# Patient Record
Sex: Male | Born: 1970
Health system: Southern US, Community
[De-identification: ages and names within clinical notes are randomized; demographics above are authoritative.]

## PROBLEM LIST (undated history)

## (undated) DIAGNOSIS — I10 Essential (primary) hypertension: Secondary | ICD-10-CM

## (undated) DIAGNOSIS — F32A Depression, unspecified: Secondary | ICD-10-CM

## (undated) DIAGNOSIS — F329 Major depressive disorder, single episode, unspecified: Secondary | ICD-10-CM

## (undated) HISTORY — DX: Depression, unspecified: F32.A

---
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
## 2009-10-31 ENCOUNTER — Emergency Department (HOSPITAL_COMMUNITY): Admission: EM | Admit: 2009-10-31 | Discharge: 2009-10-31 | Payer: Self-pay | Admitting: Emergency Medicine

## 2015-02-18 ENCOUNTER — Emergency Department (HOSPITAL_COMMUNITY)
Admission: EM | Admit: 2015-02-18 | Discharge: 2015-02-18 | Disposition: A | Discharge status: Home / Self Care | Payer: Self-pay | Attending: Emergency Medicine | Admitting: Emergency Medicine

## 2015-02-18 ENCOUNTER — Encounter (HOSPITAL_COMMUNITY): Payer: Self-pay | Admitting: *Deleted

## 2015-02-18 DIAGNOSIS — R42 Dizziness and giddiness: Secondary | ICD-10-CM | POA: Insufficient documentation

## 2015-02-18 DIAGNOSIS — Z720 Tobacco use: Secondary | ICD-10-CM | POA: Insufficient documentation

## 2015-02-18 DIAGNOSIS — I10 Essential (primary) hypertension: Secondary | ICD-10-CM | POA: Insufficient documentation

## 2015-02-18 HISTORY — DX: Essential (primary) hypertension: I10

## 2015-02-18 LAB — CBC WITH DIFFERENTIAL/PLATELET
Basophils Absolute: 0 10*3/uL (ref 0.0–0.1)
Basophils Relative: 0 %
EOS ABS: 0.1 10*3/uL (ref 0.0–0.7)
EOS PCT: 2 %
HCT: 47 % (ref 39.0–52.0)
Hemoglobin: 16.2 g/dL (ref 13.0–17.0)
LYMPHS ABS: 2.5 10*3/uL (ref 0.7–4.0)
Lymphocytes Relative: 44 %
MCH: 31.7 pg (ref 26.0–34.0)
MCHC: 34.5 g/dL (ref 30.0–36.0)
MCV: 92 fL (ref 78.0–100.0)
MONO ABS: 0.4 10*3/uL (ref 0.1–1.0)
MONOS PCT: 7 %
Neutro Abs: 2.6 10*3/uL (ref 1.7–7.7)
Neutrophils Relative %: 47 %
PLATELETS: 198 10*3/uL (ref 150–400)
RBC: 5.11 MIL/uL (ref 4.22–5.81)
RDW: 12.9 % (ref 11.5–15.5)
WBC: 5.7 10*3/uL (ref 4.0–10.5)

## 2015-02-18 LAB — BASIC METABOLIC PANEL
Anion gap: 9 (ref 5–15)
BUN: 12 mg/dL (ref 6–20)
CALCIUM: 10 mg/dL (ref 8.9–10.3)
CHLORIDE: 103 mmol/L (ref 101–111)
CO2: 28 mmol/L (ref 22–32)
CREATININE: 1.12 mg/dL (ref 0.61–1.24)
GFR calc Af Amer: >60 mL/min (ref >60)
Glucose, Bld: 91 mg/dL (ref 65–99)
Potassium: 4.4 mmol/L (ref 3.5–5.1)
SODIUM: 140 mmol/L (ref 135–145)

## 2015-02-18 MED ORDER — LISINOPRIL 10 MG PO TABS
10.0000 mg | ORAL_TABLET | Freq: Once | ORAL | Status: AC
  Administered 2015-02-18: 10 mg via ORAL
  Filled 2015-02-18: qty 1

## 2015-02-18 MED ORDER — LISINOPRIL 10 MG PO TABS: 10.0000 mg | ORAL_TABLET | Freq: Every day | ORAL | Status: DC

## 2015-02-18 NOTE — ED Notes (Signed)
PT HOOKED UP TO MONITOR WITH THE BP CUFF AND PULSE OX

## 2015-02-18 NOTE — Discharge Instructions (Signed)
Hypertension Hypertension, commonly called high blood pressure, is when the force of blood pumping through your arteries is too strong. Your arteries are the blood vessels that carry blood from your heart throughout your body. A blood pressure reading consists of a higher number over a lower number, such as 110/72. The higher number (systolic) is the pressure inside your arteries when your heart pumps. The lower number (diastolic) is the pressure inside your arteries when your heart relaxes. Ideally you want your blood pressure below 120/80. Hypertension forces your heart to work harder to pump blood. Your arteries may become narrow or stiff. Having untreated or uncontrolled hypertension can cause heart attack, stroke, kidney disease, and other problems. RISK FACTORS Some risk factors for high blood pressure are controllable. Others are not.  Risk factors you cannot control include:   Race. You may be at higher risk if you are African American.  Age. Risk increases with age.  Gender. Men are at higher risk than women before age 45 years. After age 65, women are at higher risk than men. Risk factors you can control include:  Not getting enough exercise or physical activity.  Being overweight.  Getting too much fat, sugar, calories, or salt in your diet.  Drinking too much alcohol. SIGNS AND SYMPTOMS Hypertension does not usually cause signs or symptoms. Extremely high blood pressure (hypertensive crisis) may cause headache, anxiety, shortness of breath, and nosebleed. DIAGNOSIS To check if you have hypertension, your health care provider will measure your blood pressure while you are seated, with your arm held at the level of your heart. It should be measured at least twice using the same arm. Certain conditions can cause a difference in blood pressure between your right and left arms. A blood pressure reading that is higher than normal on one occasion does not mean that you need treatment. If  it is not clear whether you have high blood pressure, you may be asked to return on a different day to have your blood pressure checked again. Or, you may be asked to monitor your blood pressure at home for 1 or more weeks. TREATMENT Treating high blood pressure includes making lifestyle changes and possibly taking medicine. Living a healthy lifestyle can help lower high blood pressure. You may need to change some of your habits. Lifestyle changes may include:  Following the DASH diet. This diet is high in fruits, vegetables, and whole grains. It is low in salt, red meat, and added sugars.  Keep your sodium intake below 2,300 mg per day.  Getting at least 30-45 minutes of aerobic exercise at least 4 times per week.  Losing weight if necessary.  Not smoking.  Limiting alcoholic beverages.  Learning ways to reduce stress. Your health care provider may prescribe medicine if lifestyle changes are not enough to get your blood pressure under control, and if one of the following is true:  You are 18-59 years of age and your systolic blood pressure is above 140.  You are 60 years of age or older, and your systolic blood pressure is above 150.  Your diastolic blood pressure is above 90.  You have diabetes, and your systolic blood pressure is over 140 or your diastolic blood pressure is over 90.  You have kidney disease and your blood pressure is above 140/90.  You have heart disease and your blood pressure is above 140/90. Your personal target blood pressure may vary depending on your medical conditions, your age, and other factors. HOME CARE INSTRUCTIONS    Have your blood pressure rechecked as directed by your health care provider.   Take medicines only as directed by your health care provider. Follow the directions carefully. Blood pressure medicines must be taken as prescribed. The medicine does not work as well when you skip doses. Skipping doses also puts you at risk for  problems.  Do not smoke.   Monitor your blood pressure at home as directed by your health care provider. SEEK MEDICAL CARE IF:   You think you are having a reaction to medicines taken.  You have recurrent headaches or feel dizzy.  You have swelling in your ankles.  You have trouble with your vision. SEEK IMMEDIATE MEDICAL CARE IF:  You develop a severe headache or confusion.  You have unusual weakness, numbness, or feel faint.  You have severe chest or abdominal pain.  You vomit repeatedly.  You have trouble breathing. MAKE SURE YOU:   Understand these instructions.  Will watch your condition.  Will get help right away if you are not doing well or get worse.   This information is not intended to replace advice given to you by your health care provider. Make sure you discuss any questions you have with your health care provider.   Document Released: 04/10/2005 Document Revised: 08/25/2014 Document Reviewed: 01/31/2013 Elsevier Interactive Patient Education 2016 [employer]  

## 2015-02-18 NOTE — ED Notes (Addendum)
Patient reports he is has no been taking his blood pressure medication, lisinopril, as prescribed and he feels like his blood pressure is back up. Patient reports intermittent headaches and dizziness. Patient also reports increased stress.

## 2015-02-18 NOTE — ED Provider Notes (Signed)
CSN: [id number]     Arrival date & time 02/18/15  1345 History   First MD Initiated Contact with Patient 02/18/15 1625     Chief Complaint  Patient presents with  . Hypertension  . Headache     (Consider location/radiation/quality/duration/timing/severity/associated sxs/prior Treatment) Patient is a 44 y.o. male presenting with dizziness. The history is provided by the patient.  Dizziness Quality:  Vertigo Severity:  Moderate Onset quality:  Gradual Duration:  1 day Timing:  Intermittent Progression:  Waxing and waning Chronicity:  Recurrent Context: not when bending over, not with bowel movement, not with ear pain, not with eye movement, not with head movement, not with inactivity, not with loss of consciousness, not with medication, not with physical activity, not when standing up and not when urinating   Relieved by:  None tried Worsened by:  Nothing Ineffective treatments:  None tried Associated symptoms: no blood in stool, no chest pain, no diarrhea, no headaches, no nausea, no palpitations, no shortness of breath, no syncope, no tinnitus, no vision changes, no vomiting and no weakness   Risk factors: no hx of vertigo, no Meniere's disease and no multiple medications     Past Medical History  Diagnosis Date  . Hypertension    No past surgical history on file. No family history on file. Social History  Substance Use Topics  . Smoking status: Current Every Day Smoker  . Smokeless tobacco: Not on file  . Alcohol Use: No    Review of Systems  Constitutional: Negative for fever.  HENT: Negative for facial swelling and tinnitus.   Respiratory: Negative for shortness of breath.   Cardiovascular: Negative for chest pain, palpitations and syncope.  Gastrointestinal: Negative for nausea, vomiting, abdominal pain, diarrhea and blood in stool.  Genitourinary: Negative for dysuria.  Musculoskeletal: Negative for back pain.  Skin: Negative for rash.  Neurological: Positive  for dizziness. Negative for weakness and headaches.  Psychiatric/Behavioral: Negative for confusion.      Allergies  Review of patient's allergies indicates no known allergies.  Home Medications   Prior to Admission medications   Medication Sig Start Date End Date Taking? Authorizing Provider  lisinopril (PRINIVIL,ZESTRIL) 10 MG tablet Take 1 tablet (10 mg total) by mouth daily. 02/18/15   Gavin PoundJustin Avelardo Reesman, MD  Multiple Vitamin (MULTIVITAMIN WITH MINERALS) TABS tablet Take 1 tablet by mouth daily.   Yes Historical Provider, MD  Omega-3 Fatty Acids (FISH OIL PO) Take 1 tablet by mouth daily.   Yes Historical Provider, MD   BP 136/86 mmHg  Pulse 68  Temp(Src) 99 F (37.2 C) (Oral)  Resp 16  SpO2 94% Physical Exam  Constitutional: He is oriented to person, place, and time. He appears well-developed and well-nourished. No distress.  HENT:  Head: Normocephalic and atraumatic.  Right Ear: External ear normal.  Left Ear: External ear normal.  Nose: Nose normal.  Mouth/Throat: Oropharynx is clear and moist. No oropharyngeal exudate.  Eyes: Conjunctivae and EOM are normal. Pupils are equal, round, and reactive to light. Right eye exhibits no discharge. Left eye exhibits no discharge. No scleral icterus.  Neck: Normal range of motion. Neck supple. No JVD present. No tracheal deviation present. No thyromegaly present.  Cardiovascular: Normal rate, regular rhythm and intact distal pulses.   Pulmonary/Chest: Effort normal. No stridor. No respiratory distress. He has no wheezes. He has no rales. He exhibits no tenderness.  Abdominal: Soft. He exhibits no distension. There is no tenderness.  Musculoskeletal: Normal range of motion. He exhibits no  edema or tenderness.  Lymphadenopathy:    He has no cervical adenopathy.  Neurological: He is alert and oriented to person, place, and time. He has normal strength. He displays no atrophy and no tremor. No cranial nerve deficit or sensory deficit. He  exhibits normal muscle tone. He displays no seizure activity. Coordination and gait normal. GCS eye subscore is 4. GCS verbal subscore is 5. GCS motor subscore is 6.  Skin: Skin is warm and dry. No rash noted. He is not diaphoretic. No erythema. No pallor.  Psychiatric: He has a normal mood and affect. His behavior is normal. Judgment and thought content normal.  Nursing note and vitals reviewed.   ED Course  Procedures (including critical care time) Labs Review Labs Reviewed  CBC WITH DIFFERENTIAL/PLATELET  BASIC METABOLIC PANEL    Imaging Review No results found. I have personally reviewed and evaluated these images and lab results as part of my medical decision-making.   EKG Interpretation None      MDM   Final diagnoses:  Essential hypertension    Patient endorses a history of hypertension. Has not been on medication for the past year due to cost issues. He had some intermittent dizziness lightheadedness associated with what he thought were reviewed and elevated blood pressure. He has not been checking it regularly. Patient does not have any chest pain, shortness of breath, or other concerning symptoms at this time. Patient referred for primary care follow-up. We will put him on 10 mg of lisinopril as he knows he was on a starting dose but is not sure if it was on higher doses.    Patient was given return precautions for HTN.  Pt advised on use of medications as applicable.  Patient was advised to monitor for symptoms of simple symptomatic hypotension and to monitor his blood pressures.  Advised to return for actely worsening symptoms, inability to take medications, or other acute concerns.  Advised to follow up with PCP in 1 week.  Patient was in agreement with and expressed understanding of follow plan, plan of care, and return precautions.  All questions answered prior to discharge.  Patient was discharged in stable condition, ambulating without difficulty.  Patient care was  discussed with my attending, Dr. Fayrene Fearing.    Gavin Pound, MD 02/19/15 1610  Rolland Porter, MD 02/24/15 9604  Rolland Porter, MD 02/24/15 [phone]

## 2015-12-21 ENCOUNTER — Ambulatory Visit (INDEPENDENT_AMBULATORY_CARE_PROVIDER_SITE_OTHER): Payer: Self-pay | Admitting: Family Medicine

## 2015-12-21 VITALS — BP 152/100 | HR 76 | Temp 98.4°F | Resp 18 | Ht 67.5 in | Wt 212.0 lb

## 2015-12-21 DIAGNOSIS — R809 Proteinuria, unspecified: Secondary | ICD-10-CM | POA: Diagnosis not present

## 2015-12-21 DIAGNOSIS — I10 Essential (primary) hypertension: Secondary | ICD-10-CM | POA: Diagnosis not present

## 2015-12-21 DIAGNOSIS — Z130 Encounter for screening for diseases of the blood and blood-forming organs and certain disorders involving the immune mechanism: Secondary | ICD-10-CM

## 2015-12-21 LAB — CBC WITH DIFFERENTIAL/PLATELET
BASOS ABS: 0 {cells}/uL (ref 0–200)
Basophils Relative: 0 %
EOS PCT: 1 %
Eosinophils Absolute: 66 cells/uL (ref 15–500)
HCT: 49.5 % (ref 38.5–50.0)
HEMOGLOBIN: 16.9 g/dL (ref 13.2–17.1)
LYMPHS ABS: 2508 {cells}/uL (ref 850–3900)
LYMPHS PCT: 38 %
MCH: 30.8 pg (ref 27.0–33.0)
MCHC: 34.1 g/dL (ref 32.0–36.0)
MCV: 90.3 fL (ref 80.0–100.0)
MPV: 10.3 fL (ref 7.5–12.5)
Monocytes Absolute: 528 cells/uL (ref 200–950)
Monocytes Relative: 8 %
NEUTROS PCT: 53 %
Neutro Abs: 3498 cells/uL (ref 1500–7800)
Platelets: 227 10*3/uL (ref 140–400)
RBC: 5.48 MIL/uL (ref 4.20–5.80)
RDW: 13.9 % (ref 11.0–15.0)
WBC: 6.6 10*3/uL (ref 3.8–10.8)

## 2015-12-21 LAB — COMPLETE METABOLIC PANEL WITH GFR
ALBUMIN: 4.9 g/dL (ref 3.6–5.1)
ALK PHOS: 65 U/L (ref 40–115)
ALT: 69 U/L — AB (ref 9–46)
AST: 31 U/L (ref 10–40)
BILIRUBIN TOTAL: 0.7 mg/dL (ref 0.2–1.2)
BUN: 13 mg/dL (ref 7–25)
CO2: 25 mmol/L (ref 20–31)
CREATININE: 1.13 mg/dL (ref 0.60–1.35)
Calcium: 9.9 mg/dL (ref 8.6–10.3)
Chloride: 105 mmol/L (ref 98–110)
GFR, Est African American: >89 mL/min (ref >=60)
GFR, Est Non African American: 79 mL/min (ref >=60)
GLUCOSE: 84 mg/dL (ref 65–99)
POTASSIUM: 4.5 mmol/L (ref 3.5–5.3)
SODIUM: 138 mmol/L (ref 135–146)
TOTAL PROTEIN: 7.7 g/dL (ref 6.1–8.1)

## 2015-12-21 MED ORDER — LISINOPRIL-HYDROCHLOROTHIAZIDE 10-12.5 MG PO TABS: 1.0000 | ORAL_TABLET | Freq: Every day | ORAL | 3 refills | Status: DC

## 2015-12-21 NOTE — Progress Notes (Addendum)
Patient ID: Nicholas Vargas, male    DOB: 05/30/1970, 45 y.o.   MRN: [id number]  PCP: Dolores Lorylark,Michael Lee, PA-C  Chief Complaint  Patient presents with  . Hypertension    Subjective:   HPI Presents for evaluation of elevated blood pressure. 45 year old African-American male presents today for the elevated blood pressure following a pre[facility] [employer] [facility] [employer]. Patient reports that his diastolic reading top 106 yesterday and on retake his blood pressure was 150/78.  He was referred to his primary care provider to have blood pressure evaluated and treated.  Reports that he has previously been taking lisinopril for hypertension and hasn't taken it for several months. He also reports some weight gain and a decline in  physical activity.Reports that he was diagnosed with hypertension almost 2 years ago.  He denies any chest pain shortness of breath headache or dizziness.  . Social History   Social History  . Marital status: Single    Spouse name: N/A  . Number of children: N/A  . Years of education: N/A   Occupational History  . Not on file.   Social History Main Topics  . Smoking status: Current Every Day Smoker  . Smokeless tobacco: Never Used  . Alcohol use Yes  . Drug use: No  . Sexual activity: Not on file   Other Topics Concern  . Not on file   Social History Narrative  . No narrative on file    .History reviewed. No pertinent family history.     Review of Systems  Constitutional: Positive for fatigue.  Respiratory: Negative.   Cardiovascular: Negative.   Neurological: Negative for dizziness and headaches.   See HPI    There are no active problems to display for this patient.    Prior to Admission medications   Medication Sig Start Date End Date Taking? Authorizing Provider  lisinopril (PRINIVIL,ZESTRIL) 10 MG tablet Take 1 tablet (10 mg total) by mouth daily. Patient not taking: Reported on 12/21/2015 02/18/15   Gavin PoundJustin Brooten, MD     No  Known Allergies     Objective:  Physical Exam  Constitutional: He is oriented to person, place, and time. He appears well-developed and well-nourished.  HENT:  Head: Normocephalic and atraumatic.  Left Ear: External ear normal.  Nose: Nose normal.  Mouth/Throat: Oropharynx is clear and moist.  Eyes: Conjunctivae and EOM are normal. Pupils are equal, round, and reactive to light.  Neck: Normal range of motion. Neck supple.  Cardiovascular: Normal rate, regular rhythm, normal heart sounds and intact distal pulses.   Pulmonary/Chest: Effort normal and breath sounds normal.  Musculoskeletal: Normal range of motion.  Neurological: He is alert and oriented to person, place, and time.  Skin: Skin is warm and dry.  Psychiatric: He has a normal mood and affect. His behavior is normal. Judgment and thought content normal.   . Vitals:   12/21/15 1222  BP: (!) 152/100  Pulse: 76  Resp: 18  Temp: 98.4 F (36.9 C)   Assessment & Plan:  1. Essential hypertension, uncontrolled. Patient reports non-compliance with medication regimen.   - COMPLETE METABOLIC PANEL WITH GFR, elevated ALT.   - POCT urinalysis dipstick-normal - POCT Microscopic Urinalysis ([facility])-normal   Plan:  Start- . lisinopril-hydrochlorothiazide (PRINZIDE,ZESTORETIC) 10-12.5 MG tablet   Check fasting lipids and recheck liver enzymes in 4-6 weeks.  2. Screening for deficiency anemia - CBC with Differential/Platelet-normal  Return 4-6 weeks for follow-up.  Godfrey PickKimberly S. Tiburcio PeaHarris, MSN, FNP-C [facility]

## 2015-12-21 NOTE — Patient Instructions (Addendum)
Return for care in 4-6 weeks to have your blood pressure rechecked.  Check your blood pressure periodically and maintain a log of readings.  IF you received an x-ray today, you will receive an invoice from [facility]. Please contact [facility] at [phone] with questions or concerns regarding your invoice.   IF you received labwork today, you will receive an invoice from [employer]. Please contact [facility] at [phone] with questions or concerns regarding your invoice.   Our billing staff will not be able to assist you with questions regarding bills from these companies.  You will be contacted with the lab results as soon as they are available. The fastest way to get your results is to activate your My Chart account. Instructions are located on the last page of this paperwork. If you have not heard from [place] regarding the results in 2 weeks, please contact this office.     Hypertension Hypertension, commonly called high blood pressure, is when the force of blood pumping through your arteries is too strong. Your arteries are the blood vessels that carry blood from your heart throughout your body. A blood pressure reading consists of a higher number over a lower number, such as 110/72. The higher number (systolic) is the pressure inside your arteries when your heart pumps. The lower number (diastolic) is the pressure inside your arteries when your heart relaxes. Ideally you want your blood pressure below 120/80. Hypertension forces your heart to work harder to pump blood. Your arteries may become narrow or stiff. Having untreated or uncontrolled hypertension can cause heart attack, stroke, kidney disease, and other problems. RISK FACTORS Some risk factors for high blood pressure are controllable. Others are not.  Risk factors you cannot control include:   Race. You may be at higher risk if you are African American.  Age. Risk increases with  age.  Gender. Men are at higher risk than women before age 68 years. After age 50, women are at higher risk than men. Risk factors you can control include:  Not getting enough exercise or physical activity.  Being overweight.  Getting too much fat, sugar, calories, or salt in your diet.  Drinking too much alcohol. SIGNS AND SYMPTOMS Hypertension does not usually cause signs or symptoms. Extremely high blood pressure (hypertensive crisis) may cause headache, anxiety, shortness of breath, and nosebleed. DIAGNOSIS To check if you have hypertension, your health care provider will measure your blood pressure while you are seated, with your arm held at the level of your heart. It should be measured at least twice using the same arm. Certain conditions can cause a difference in blood pressure between your right and left arms. A blood pressure reading that is higher than normal on one occasion does not mean that you need treatment. If it is not clear whether you have high blood pressure, you may be asked to return on a different day to have your blood pressure checked again. Or, you may be asked to monitor your blood pressure at home for 1 or more weeks. TREATMENT Treating high blood pressure includes making lifestyle changes and possibly taking medicine. Living a healthy lifestyle can help lower high blood pressure. You may need to change some of your habits. Lifestyle changes may include:  Following the DASH diet. This diet is high in fruits, vegetables, and whole grains. It is low in salt, red meat, and added sugars.  Keep your sodium intake below 2,300 mg per day.  Getting at least 30-45  minutes of aerobic exercise at least 4 times per week.  Losing weight if necessary.  Not smoking.  Limiting alcoholic beverages.  Learning ways to reduce stress. Your health care provider may prescribe medicine if lifestyle changes are not enough to get your blood pressure under control, and if one of  the following is true:  You are 54-58 years of age and your systolic blood pressure is above 140.  You are 69 years of age or older, and your systolic blood pressure is above 150.  Your diastolic blood pressure is above 90.  You have diabetes, and your systolic blood pressure is over 140 or your diastolic blood pressure is over 90.  You have kidney disease and your blood pressure is above 140/90.  You have heart disease and your blood pressure is above 140/90. Your personal target blood pressure may vary depending on your medical conditions, your age, and other factors. HOME CARE INSTRUCTIONS  Have your blood pressure rechecked as directed by your health care provider.   Take medicines only as directed by your health care provider. Follow the directions carefully. Blood pressure medicines must be taken as prescribed. The medicine does not work as well when you skip doses. Skipping doses also puts you at risk for problems.  Do not smoke.   Monitor your blood pressure at home as directed by your health care provider. SEEK MEDICAL CARE IF:   You think you are having a reaction to medicines taken.  You have recurrent headaches or feel dizzy.  You have swelling in your ankles.  You have trouble with your vision. SEEK IMMEDIATE MEDICAL CARE IF:  You develop a severe headache or confusion.  You have unusual weakness, numbness, or feel faint.  You have severe chest or abdominal pain.  You vomit repeatedly.  You have trouble breathing. MAKE SURE YOU:   Understand these instructions.  Will watch your condition.  Will get help right away if you are not doing well or get worse.   This information is not intended to replace advice given to you by your health care provider. Make sure you discuss any questions you have with your health care provider.   Document Released: 04/10/2005 Document Revised: 08/25/2014 Document Reviewed: 01/31/2013 Elsevier Interactive Patient  Education 2016 [employer].  Exercising to [employer] Exercising regularly is important. It has many health benefits, such as:  Improving your overall fitness, flexibility, and endurance.  Increasing your bone density.  Helping with weight control.  Decreasing your body fat.  Increasing your muscle strength.  Reducing stress and tension.  Improving your overall health. In order to become healthy and stay healthy, it is recommended that you do moderate-intensity and vigorous-intensity exercise. You can tell that you are exercising at a moderate intensity if you have a higher heart rate and faster breathing, but you are still able to hold a conversation. You can tell that you are exercising at a vigorous intensity if you are breathing much harder and faster and cannot hold a conversation while exercising. HOW OFTEN SHOULD I EXERCISE? Choose an activity that you enjoy and set realistic goals. Your health care provider can help you to make an activity plan that works for you. Exercise regularly as directed by your health care provider. This may include:   Doing resistance training twice each week, such as:  Push-ups.  Sit-ups.  Lifting weights.  Using resistance bands.  Doing a given intensity of exercise for a given amount of time. Choose from these  options:  150 minutes of moderate-intensity exercise every week.  75 minutes of vigorous-intensity exercise every week.  A mix of moderate-intensity and vigorous-intensity exercise every week. Children, pregnant women, people who are out of shape, people who are overweight, and older adults may need to consult a health care provider for individual recommendations. If you have any sort of medical condition, be sure to consult your health care provider before starting a new exercise program.  WHAT ARE SOME EXERCISE IDEAS? Some moderate-intensity exercise ideas include:   Walking at a rate of 1 mile in 15  minutes.  Biking.  Hiking.  Golfing.  Dancing. Some vigorous-intensity exercise ideas include:   Walking at a rate of at least 4.5 miles per hour.  Jogging or running at a rate of 5 miles per hour.  Biking at a rate of at least 10 miles per hour.  Lap swimming.  Roller-skating or in-line skating.  Cross-country skiing.  Vigorous competitive sports, such as football, basketball, and soccer.  Jumping rope.  Aerobic dancing. WHAT ARE SOME EVERYDAY ACTIVITIES THAT CAN HELP ME TO GET EXERCISE?  Yard work, such as:  Child psychotherapistushing a lawn mower.  Raking and bagging leaves.  Washing and waxing your car.  Pushing a stroller.  Shoveling snow.  Gardening.  Washing windows or floors. HOW CAN I BE MORE ACTIVE IN MY DAY-TO-DAY ACTIVITIES?  Use the stairs instead of the elevator.  Take a walk during your lunch break.  If you drive, park your car farther away from work or school.  If you take public transportation, get off one stop early and walk the rest of the way.  Make all of your phone calls while standing up and walking around.  Get up, stretch, and walk around every 30 minutes throughout the day. WHAT GUIDELINES SHOULD I FOLLOW WHILE EXERCISING?  Do not exercise so much that you hurt yourself, feel dizzy, or get very short of breath.  Consult your health care provider before starting a new exercise program.  Wear comfortable clothes and shoes with good support.  Drink plenty of water while you exercise to prevent dehydration or heat stroke. Body water is lost during exercise and must be replaced.  Work out until you breathe faster and your heart beats faster.   This information is not intended to replace advice given to you by your health care provider. Make sure you discuss any questions you have with your health care provider.   Document Released: 05/13/2010 Document Revised: 05/01/2014 Document Reviewed: 09/11/2013 Elsevier Interactive Patient Education  [employer] Elsevier Inc.

## 2015-12-23 ENCOUNTER — Encounter: Payer: Self-pay | Admitting: Family Medicine

## 2015-12-30 ENCOUNTER — Encounter: Payer: Self-pay | Admitting: Family Medicine

## 2015-12-30 ENCOUNTER — Other Ambulatory Visit: Payer: Self-pay | Admitting: Family Medicine

## 2015-12-30 DIAGNOSIS — R740 Nonspecific elevation of levels of transaminase and lactic acid dehydrogenase [LDH]: Principal | ICD-10-CM

## 2015-12-30 DIAGNOSIS — R7401 Elevation of levels of liver transaminase levels: Secondary | ICD-10-CM

## 2016-04-27 MED FILL — LISINOPRIL-HCTZ 10-12.5 MG: 10-12.5 | 90 days supply | Qty: 90 | Fill #0

## 2017-07-19 ENCOUNTER — Encounter (HOSPITAL_COMMUNITY): Payer: Self-pay | Admitting: Emergency Medicine

## 2017-07-19 ENCOUNTER — Other Ambulatory Visit: Payer: Self-pay

## 2017-07-19 ENCOUNTER — Ambulatory Visit (HOSPITAL_COMMUNITY)
Admission: EM | Admit: 2017-07-19 | Discharge: 2017-07-19 | Disposition: A | Discharge status: Home / Self Care | Payer: 59 | Attending: Emergency Medicine | Admitting: Emergency Medicine

## 2017-07-19 DIAGNOSIS — R1030 Lower abdominal pain, unspecified: Secondary | ICD-10-CM

## 2017-07-19 LAB — POCT I-STAT, CHEM 8
BUN: 15 mg/dL (ref 6–20)
CREATININE: 1.1 mg/dL (ref 0.61–1.24)
Calcium, Ion: 1.23 mmol/L (ref 1.15–1.40)
Chloride: 101 mmol/L (ref 101–111)
GLUCOSE: 103 mg/dL — AB (ref 65–99)
HEMATOCRIT: 51 % (ref 39.0–52.0)
Hemoglobin: 17.3 g/dL — ABNORMAL HIGH (ref 13.0–17.0)
Potassium: 4.3 mmol/L (ref 3.5–5.1)
Sodium: 138 mmol/L (ref 135–145)
TCO2: 26 mmol/L (ref 22–32)

## 2017-07-19 MED ORDER — ONDANSETRON 4 MG PO TBDP: 4.0000 mg | ORAL_TABLET | Freq: Three times a day (TID) | ORAL | 0 refills | Status: DC | PRN

## 2017-07-19 MED ORDER — OMEPRAZOLE 20 MG PO CPDR: 20.0000 mg | DELAYED_RELEASE_CAPSULE | Freq: Every day | ORAL | 0 refills | Status: DC

## 2017-07-19 NOTE — Discharge Instructions (Addendum)
Please begin trial of Prilosec, please take this daily consistently for 2-3 weeks.  Please use Zofran as needed for nausea.  Please continue to monitor your bowel movements, I expect this to gradually return to normal as well as abdominal pain.  Please drink plenty of fluids.   May consider following up with gastroenterology if symptoms persisting.

## 2017-07-19 NOTE — ED Provider Notes (Signed)
[facility]    CSN: [id number] Arrival date & time: 07/19/17  1024     History   Chief Complaint Chief Complaint  Patient presents with  . Abdominal Pain    HPI Nicholas Vargas is a 47 y.o. male history of hypertension presenting today with concern for abdominal pain as well as abnormal bowel movements.  States that he has had a rumbling sensation in his lower abdomen that is been associated with gas and occasional bowel movements.  He notices this is worse after eating or early in the morning.  This is been going on for approximately 5 days.  He is also noted to have increased urge to defecate.  He has had one episode of watery stools, overall stools have been loose.  Going approximately 4 times a day.  He has not had any bowel movements today.  Prior to onset of symptoms his bowels are regular, would go daily.  Denies any overt blood in the stools, but does note that his stools have been darker.  Does endorse some lightheadedness and dizziness.  Also having some nausea, denies vomiting.  Tolerating oral intake.  Patient states he drinks and eats a lot of spicy foods.  Also endorses an occasional burning sensation in his chest as well as sour burps.  HPI  Past Medical History:  Diagnosis Date  . Hypertension     There are no active problems to display for this patient.   History reviewed. No pertinent surgical history.     Home Medications    Prior to Admission medications   Medication Sig Start Date End Date Taking? Authorizing Provider  lisinopril-hydrochlorothiazide (PRINZIDE,ZESTORETIC) 10-12.5 MG tablet Take 1 tablet by mouth daily. 12/21/15   Bing NeighborsHarris, Kimberly S, FNP  omeprazole (PRILOSEC) 20 MG capsule Take 1 capsule (20 mg total) by mouth daily. 07/19/17   Wieters, Hallie C, PA-C  ondansetron (ZOFRAN ODT) 4 MG disintegrating tablet Take 1 tablet (4 mg total) by mouth every 8 (eight) hours as needed for nausea or vomiting. 07/19/17   Wieters, Junius CreamerHallie C, PA-C     Family History History reviewed. No pertinent family history.  Social History Social History   Tobacco Use  . Smoking status: Current Every Day Smoker  . Smokeless tobacco: Never Used  Substance Use Topics  . Alcohol use: Yes  . Drug use: No     Allergies   Patient has no known allergies.   Review of Systems Review of Systems  Constitutional: Negative for activity change, appetite change, fatigue and fever.  HENT: Negative for congestion, ear pain, postnasal drip, rhinorrhea and sore throat.   Eyes: Negative for pain and itching.  Respiratory: Negative for cough and shortness of breath.   Cardiovascular: Negative for chest pain.  Gastrointestinal: Positive for abdominal pain, diarrhea and nausea. Negative for constipation and vomiting.  Genitourinary: Negative for dysuria.  Musculoskeletal: Negative for myalgias.  Skin: Negative for rash.  Neurological: Positive for dizziness and light-headedness. Negative for weakness and headaches.     Physical Exam Triage Vital Signs ED Triage Vitals  Enc Vitals Group     BP 07/19/17 1059 (!) 156/82     Pulse Rate 07/19/17 1059 93     Resp --      Temp 07/19/17 1059 98.6 F (37 C)     Temp Source 07/19/17 1059 Oral     SpO2 07/19/17 1059 100 %     Weight --      Height --  Head Circumference --      Peak Flow --      Pain Score 07/19/17 1056 5     Pain Loc --      Pain Edu? --      Excl. in GC? --    No data found.  Updated Vital Signs BP (!) 156/82 (BP Location: Left Arm)   Pulse 93   Temp 98.6 F (37 C) (Oral)   SpO2 100%   Visual Acuity Right Eye Distance:   Left Eye Distance:   Bilateral Distance:    Right Eye Near:   Left Eye Near:    Bilateral Near:     Physical Exam  Constitutional: He appears well-developed and well-nourished.  HENT:  Head: Normocephalic and atraumatic.  Mouth/Throat: Oropharynx is clear and moist.  Eyes: Pupils are equal, round, and reactive to light. Conjunctivae and  EOM are normal.  Neck: Neck supple.  Cardiovascular: Normal rate and regular rhythm.  No murmur heard. Pulmonary/Chest: Effort normal and breath sounds normal. No respiratory distress.  Abdominal: Soft. There is no tenderness.  Abdomen is soft, nondistended , nontender to light and deep palpation  Musculoskeletal: He exhibits no edema.  Neurological: He is alert.  Skin: Skin is warm and dry.  Psychiatric: He has a normal mood and affect.  Nursing note and vitals reviewed.    UC Treatments / Results  Labs (all labs ordered are listed, but only abnormal results are displayed) Labs Reviewed - No data to display  EKG None Radiology No results found.  Procedures Procedures (including critical care time)  Medications Ordered in UC Medications - No data to display   Initial Impression / Assessment and Plan / UC Course  I have reviewed the triage vital signs and the nursing notes.  Pertinent labs & imaging results that were available during my care of the patient were reviewed by me and considered in my medical decision making (see chart for details).     Patient likely with form of gastroenteritis possibly some aspects of GERD.  Bowel obstruction seems unlikely given patient moving bowels.  Tolerating oral intake well with mild nausea.  Discomfort is relatively manageable.  Will do trial of Prilosec.  Zofran for nausea.  We will continue to monitor bowels, at this time they seem to be beginning to go back to normal.  May follow-up with gastroenterology if symptoms persisting for possible motility disorder.  Hemoglobin stable. no red flags. Discussed strict return precautions. Patient verbalized understanding and is agreeable with plan.   Final Clinical Impressions(s) / UC Diagnoses   Final diagnoses:  Lower abdominal pain    ED Discharge Orders        Ordered    omeprazole (PRILOSEC) 20 MG capsule  Daily     07/19/17 1144    ondansetron (ZOFRAN ODT) 4 MG disintegrating  tablet  Every 8 hours PRN     07/19/17 1147       Controlled Substance Prescriptions [place] Controlled Substance Registry consulted? Not Applicable   Lew Dawes, [place] 07/19/17 1212

## 2017-07-19 NOTE — ED Triage Notes (Signed)
C/o abdominal cramping with diarrhea onset one week

## 2017-11-23 ENCOUNTER — Encounter (HOSPITAL_COMMUNITY): Payer: Self-pay | Admitting: *Deleted

## 2017-11-23 ENCOUNTER — Emergency Department (HOSPITAL_COMMUNITY): Payer: 59

## 2017-11-23 ENCOUNTER — Emergency Department (HOSPITAL_COMMUNITY)
Admission: EM | Admit: 2017-11-23 | Discharge: 2017-11-23 | Disposition: A | Discharge status: Home / Self Care | Payer: 59 | Attending: Emergency Medicine | Admitting: Emergency Medicine

## 2017-11-23 ENCOUNTER — Other Ambulatory Visit: Payer: Self-pay

## 2017-11-23 DIAGNOSIS — Z79899 Other long term (current) drug therapy: Secondary | ICD-10-CM | POA: Diagnosis not present

## 2017-11-23 DIAGNOSIS — F17210 Nicotine dependence, cigarettes, uncomplicated: Secondary | ICD-10-CM | POA: Diagnosis not present

## 2017-11-23 DIAGNOSIS — R1032 Left lower quadrant pain: Secondary | ICD-10-CM

## 2017-11-23 DIAGNOSIS — I10 Essential (primary) hypertension: Secondary | ICD-10-CM | POA: Insufficient documentation

## 2017-11-23 LAB — URINALYSIS, ROUTINE W REFLEX MICROSCOPIC
Bilirubin Urine: NEGATIVE
GLUCOSE, UA: NEGATIVE mg/dL
Hgb urine dipstick: NEGATIVE
Ketones, ur: NEGATIVE mg/dL
LEUKOCYTES UA: NEGATIVE
NITRITE: NEGATIVE
PH: 7 (ref 5.0–8.0)
Protein, ur: NEGATIVE mg/dL
SPECIFIC GRAVITY, URINE: 1.021 (ref 1.005–1.030)

## 2017-11-23 LAB — COMPREHENSIVE METABOLIC PANEL
ALBUMIN: 4.4 g/dL (ref 3.5–5.0)
ALT: 84 U/L — ABNORMAL HIGH (ref 0–44)
ANION GAP: 9 (ref 5–15)
AST: 53 U/L — ABNORMAL HIGH (ref 15–41)
Alkaline Phosphatase: 50 U/L (ref 38–126)
BILIRUBIN TOTAL: 1 mg/dL (ref 0.3–1.2)
BUN: 11 mg/dL (ref 6–20)
CHLORIDE: 101 mmol/L (ref 98–111)
CO2: 27 mmol/L (ref 22–32)
Calcium: 9.7 mg/dL (ref 8.9–10.3)
Creatinine, Ser: 1.23 mg/dL (ref 0.61–1.24)
GFR calc Af Amer: >60 mL/min (ref >60)
GFR calc non Af Amer: >60 mL/min (ref >60)
GLUCOSE: 142 mg/dL — AB (ref 70–99)
POTASSIUM: 4.6 mmol/L (ref 3.5–5.1)
Sodium: 137 mmol/L (ref 135–145)
Total Protein: 7.1 g/dL (ref 6.5–8.1)

## 2017-11-23 LAB — CBC
HCT: 47.1 % (ref 39.0–52.0)
HEMOGLOBIN: 15 g/dL (ref 13.0–17.0)
MCH: 29.5 pg (ref 26.0–34.0)
MCHC: 31.8 g/dL (ref 30.0–36.0)
MCV: 92.5 fL (ref 78.0–100.0)
Platelets: 207 10*3/uL (ref 150–400)
RBC: 5.09 MIL/uL (ref 4.22–5.81)
RDW: 12.7 % (ref 11.5–15.5)
WBC: 5.1 10*3/uL (ref 4.0–10.5)

## 2017-11-23 LAB — POC OCCULT BLOOD, ED: Fecal Occult Bld: NEGATIVE

## 2017-11-23 LAB — LIPASE, BLOOD: LIPASE: 36 U/L (ref 11–51)

## 2017-11-23 MED ORDER — SODIUM CHLORIDE 0.9 % IV BOLUS
1000.0000 mL | Freq: Once | INTRAVENOUS | Status: AC
  Administered 2017-11-23: 1000 mL via INTRAVENOUS

## 2017-11-23 MED ORDER — IOHEXOL 300 MG/ML  SOLN
100.0000 mL | Freq: Once | INTRAMUSCULAR | Status: AC | PRN
  Administered 2017-11-23: 100 mL via INTRAVENOUS

## 2017-11-23 MED ORDER — DICYCLOMINE HCL 20 MG PO TABS: 20.0000 mg | ORAL_TABLET | Freq: Two times a day (BID) | ORAL | 0 refills | Status: DC

## 2017-11-23 NOTE — ED Triage Notes (Signed)
Pt is not currently taking anything for his BP

## 2017-11-23 NOTE — ED Triage Notes (Signed)
Pt in c/o left sided abdominal pain with weakness, fatigue and dark stools x2 months

## 2017-11-23 NOTE — Discharge Instructions (Signed)
Return to ED for worsening symptoms, severe abdominal pain or chest pain, vomiting or coughing up blood, lightheadedness or loss of consciousness. °

## 2017-11-23 NOTE — ED Notes (Signed)
Patient transported to CT 

## 2017-11-23 NOTE — ED Provider Notes (Signed)
[facility] EMERGENCY DEPARTMENT Provider Note   CSN: [id number] Arrival date & time: 11/23/17  7829     History   Chief Complaint Chief Complaint  Patient presents with  . Abdominal Pain    HPI Nicholas Vargas is a 47 y.o. male with a past medical history of hypertension, who presents to ED for evaluation of 25-month history of left lower quadrant abdominal pain, diarrhea, darker stools.  He states that he was seen at urgent care when the symptoms first began.  He was given symptomatic treatment with PPI and antiemetics.  However, he states that his symptoms have not improved.  He reports decrease in appetite, nausea, one episode of dysuria.  He has not tried any other medicine to help with his symptoms.  He has not followed up with a GI provider before.  He denies any prior abdominal surgeries.  Denies any chest pain, shortness of breath, hematuria, vomiting, penile symptoms.  He reports daily alcohol use (1 bottle of wine daily), vaping.  Denies any other drug use.  HPI  Past Medical History:  Diagnosis Date  . Hypertension     There are no active problems to display for this patient.   History reviewed. No pertinent surgical history.      Home Medications    Prior to Admission medications   Medication Sig Start Date End Date Taking? Authorizing Provider  dicyclomine (BENTYL) 20 MG tablet Take 1 tablet (20 mg total) by mouth 2 (two) times daily. 11/23/17   Arvid Marengo, PA-C  lisinopril-hydrochlorothiazide (PRINZIDE,ZESTORETIC) 10-12.5 MG tablet Take 1 tablet by mouth daily. 12/21/15   Bing Neighbors, FNP  omeprazole (PRILOSEC) 20 MG capsule Take 1 capsule (20 mg total) by mouth daily. 07/19/17   Wieters, Hallie C, PA-C  ondansetron (ZOFRAN ODT) 4 MG disintegrating tablet Take 1 tablet (4 mg total) by mouth every 8 (eight) hours as needed for nausea or vomiting. 07/19/17   Wieters, Junius Creamer, PA-C    Family History History reviewed. No pertinent family  history.  Social History Social History   Tobacco Use  . Smoking status: Current Every Day Smoker  . Smokeless tobacco: Never Used  Substance Use Topics  . Alcohol use: Yes  . Drug use: No     Allergies   Patient has no known allergies.   Review of Systems Review of Systems  Constitutional: Positive for appetite change. Negative for chills and fever.  HENT: Negative for ear pain, rhinorrhea, sneezing and sore throat.   Eyes: Negative for photophobia and visual disturbance.  Respiratory: Negative for cough, chest tightness, shortness of breath and wheezing.   Cardiovascular: Negative for chest pain and palpitations.  Gastrointestinal: Positive for abdominal pain, blood in stool, diarrhea and nausea. Negative for constipation and vomiting.  Genitourinary: Negative for dysuria, hematuria and urgency.  Musculoskeletal: Negative for myalgias.  Skin: Negative for rash.  Neurological: Negative for dizziness, weakness and light-headedness.     Physical Exam Updated Vital Signs BP (!) 138/91   Pulse 63   Temp 98.1 F (36.7 C) (Oral)   Resp 16   SpO2 98%   Physical Exam  Constitutional: He appears well-developed and well-nourished. No distress.  HENT:  Head: Normocephalic and atraumatic.  Nose: Nose normal.  Eyes: Conjunctivae and EOM are normal. Left eye exhibits no discharge. No scleral icterus.  Neck: Normal range of motion. Neck supple.  Cardiovascular: Normal rate, regular rhythm, normal heart sounds and intact distal pulses. Exam reveals no gallop and no  friction rub.  No murmur heard. Pulmonary/Chest: Effort normal and breath sounds normal. No respiratory distress.  Abdominal: Soft. Bowel sounds are normal. He exhibits no distension. There is tenderness in the left lower quadrant. There is no guarding.  Musculoskeletal: Normal range of motion. He exhibits no edema.  Neurological: He is alert. He exhibits normal muscle tone. Coordination normal.  Skin: Skin is warm  and dry. No rash noted.  Psychiatric: He has a normal mood and affect.  Nursing note and vitals reviewed.    ED Treatments / Results  Labs (all labs ordered are listed, but only abnormal results are displayed) Labs Reviewed  COMPREHENSIVE METABOLIC PANEL - Abnormal; Notable for the following components:      Result Value   Glucose, Bld 142 (*)    AST 53 (*)    ALT 84 (*)    All other components within normal limits  URINALYSIS, ROUTINE W REFLEX MICROSCOPIC - Abnormal; Notable for the following components:   Color, Urine COLORLESS (*)    All other components within normal limits  LIPASE, BLOOD  CBC  POC OCCULT BLOOD, ED    EKG None  Radiology Ct Abdomen Pelvis W Contrast  Result Date: 11/23/2017 CLINICAL DATA:  Abdominal pain, LEFT side abdominal pain, weakness, fatigue, and dark stools for 2 months, question diverticulitis, history hypertension EXAM: CT ABDOMEN AND PELVIS WITH CONTRAST TECHNIQUE: Multidetector CT imaging of the abdomen and pelvis was performed using the standard protocol following bolus administration of intravenous contrast. Sagittal and coronal MPR images reconstructed from axial data set. CONTRAST:  OMNIPAQUE IOHEXOL 300 MG/ML SOLN IV. No oral contrast. COMPARISON:  None FINDINGS: Lower chest: Lung bases clear Hepatobiliary: Mild fatty infiltration of liver. Gallbladder and liver otherwise normal appearance. Pancreas: Normal appearance Spleen: Normal appearance Adrenals/Urinary Tract: Adrenal glands, kidneys, ureters, and bladder normal appearance. No urinary tract calcification or dilatation. Stomach/Bowel: Stomach incompletely distended. Retrocecal appendix. Bowel loops unremarkable. No colonic diverticulosis or evidence of diverticulitis identified. Vascular/Lymphatic: Vascular structures unremarkable. No adenopathy. Reproductive: Minimal prostatic enlargement Other: No free air or free fluid. Tiny RIGHT inguinal hernia containing fat. Musculoskeletal:  Unremarkable IMPRESSION: Tiny RIGHT inguinal hernia containing fat. No acute intra-abdominal or intrapelvic abnormalities. Electronically Signed   By: Ulyses Southward M.D.   On: 11/23/2017 11:08    Procedures Procedures (including critical care time)  Medications Ordered in ED Medications  sodium chloride 0.9 % bolus 1,000 mL (0 mLs Intravenous Stopped 11/23/17 1114)  iohexol (OMNIPAQUE) 300 MG/ML solution 100 mL (100 mLs Intravenous Contrast Given 11/23/17 1044)     Initial Impression / Assessment and Plan / ED Course  I have reviewed the triage vital signs and the nursing notes.  Pertinent labs & imaging results that were available during my care of the patient were reviewed by me and considered in my medical decision making (see chart for details).     47 year old male with past medical history of hypertension presents to ED for evaluation of 66-month history of left lower quadrant abdominal pain, diarrhea and darker stools.  No improvement with PPI and antiemetics given at urgent care when symptoms began.  Denies any prior abdominal surgeries, chest pain, shortness of breath.  On physical exam he is overall well-appearing.  Patient does appear anxious.  There is tenderness palpation of the left lower quadrant with no rebound or guarding noted.  He is afebrile.  Other vital signs remained within normal limits.  CBC with normal hemoglobin hematocrit.  CBC, CMP, lipase unremarkable.  Fecal  occult blood test is negative.  Urinalysis unremarkable.  CT of abdomen pelvis with no acute process.  Suspect that his symptoms could have some component of IBS.  He reports improvement in his symptoms with fluids given here.  Will give referral to GI, antispasmodics and return precautions for any severe worsening symptoms in the ED.  Portions of this note were generated with Scientist, clinical (histocompatibility and immunogenetics)Dragon dictation software. Dictation errors may occur despite best attempts at proofreading.   Final Clinical Impressions(s) / ED Diagnoses    Final diagnoses:  Left lower quadrant pain    ED Discharge Orders        Ordered    dicyclomine (BENTYL) 20 MG tablet  2 times daily     11/23/17 1200       Dietrich PatesKhatri, Sidney Silberman, PA-C 11/23/17 1202    Margarita Grizzleay, Danielle, MD 11/23/17 1535

## 2018-09-19 ENCOUNTER — Emergency Department (HOSPITAL_COMMUNITY): Payer: 59

## 2018-09-19 ENCOUNTER — Other Ambulatory Visit: Payer: Self-pay

## 2018-09-19 ENCOUNTER — Emergency Department (HOSPITAL_COMMUNITY)
Admission: EM | Admit: 2018-09-19 | Discharge: 2018-09-19 | Disposition: A | Discharge status: Home / Self Care | Payer: 59 | Attending: Emergency Medicine | Admitting: Emergency Medicine

## 2018-09-19 ENCOUNTER — Encounter (HOSPITAL_COMMUNITY): Payer: Self-pay | Admitting: *Deleted

## 2018-09-19 DIAGNOSIS — R030 Elevated blood-pressure reading, without diagnosis of hypertension: Secondary | ICD-10-CM | POA: Diagnosis not present

## 2018-09-19 DIAGNOSIS — F17210 Nicotine dependence, cigarettes, uncomplicated: Secondary | ICD-10-CM | POA: Diagnosis not present

## 2018-09-19 DIAGNOSIS — M545 Low back pain: Secondary | ICD-10-CM | POA: Insufficient documentation

## 2018-09-19 DIAGNOSIS — I10 Essential (primary) hypertension: Secondary | ICD-10-CM | POA: Insufficient documentation

## 2018-09-19 DIAGNOSIS — Z79899 Other long term (current) drug therapy: Secondary | ICD-10-CM | POA: Diagnosis not present

## 2018-09-19 MED ORDER — IBUPROFEN 600 MG PO TABS: 600.0000 mg | ORAL_TABLET | Freq: Four times a day (QID) | ORAL | 0 refills | Status: DC | PRN

## 2018-09-19 MED ORDER — LIDOCAINE 5 % EX PTCH
1.0000 | MEDICATED_PATCH | CUTANEOUS | Status: DC
  Administered 2018-09-19: 1 via TRANSDERMAL
  Filled 2018-09-19: qty 1

## 2018-09-19 MED ORDER — IBUPROFEN 400 MG PO TABS
600.0000 mg | ORAL_TABLET | Freq: Once | ORAL | Status: AC
  Administered 2018-09-19: 600 mg via ORAL
  Filled 2018-09-19: qty 1

## 2018-09-19 MED ORDER — METHOCARBAMOL 500 MG PO TABS: 500.0000 mg | ORAL_TABLET | Freq: Two times a day (BID) | ORAL | 0 refills | Status: DC
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
## 2018-10-23 ENCOUNTER — Ambulatory Visit: Payer: Commercial Managed Care - PPO | Admitting: Registered Nurse

## 2018-10-28 ENCOUNTER — Ambulatory Visit: Payer: Self-pay | Admitting: Registered Nurse

## 2018-10-29 ENCOUNTER — Other Ambulatory Visit: Payer: Self-pay

## 2018-10-29 ENCOUNTER — Ambulatory Visit: Payer: 59 | Admitting: Family Medicine

## 2018-10-29 ENCOUNTER — Encounter: Payer: Self-pay | Admitting: Family Medicine

## 2018-10-29 VITALS — BP 147/82 | HR 100 | Temp 98.7°F | Ht 67.0 in | Wt 219.2 lb

## 2018-10-29 DIAGNOSIS — K219 Gastro-esophageal reflux disease without esophagitis: Secondary | ICD-10-CM | POA: Diagnosis not present

## 2018-10-29 DIAGNOSIS — R7309 Other abnormal glucose: Secondary | ICD-10-CM | POA: Diagnosis not present

## 2018-10-29 DIAGNOSIS — I10 Essential (primary) hypertension: Secondary | ICD-10-CM

## 2018-10-29 MED ORDER — OMEPRAZOLE 20 MG PO CPDR: DELAYED_RELEASE_CAPSULE | ORAL | 5 refills | Status: DC

## 2018-10-29 MED ORDER — AMLODIPINE BESYLATE 5 MG PO TABS: 5.0000 mg | ORAL_TABLET | Freq: Every day | ORAL | 1 refills | Status: DC

## 2018-10-29 NOTE — Patient Instructions (Addendum)
     If you have lab work done today you will be contacted with your lab results within the next 2 weeks.  If you have not heard from [place] then please contact us. The fastest way to get your results is to register for My Chart.   IF you received an x-ray today, you will receive an invoice from [facility]. Please contact [facility] at [phone] with questions or concerns regarding your invoice.   IF you received labwork today, you will receive an invoice from [place]. Please contact [facility] at [phone] with questions or concerns regarding your invoice.   Our billing staff will not be able to assist you with questions regarding bills from these companies.  You will be contacted with the lab results as soon as they are available. The fastest way to get your results is to activate your My Chart account. Instructions are located on the last page of this paperwork. If you have not heard from [place] regarding the results in 2 weeks, please contact this office.     Fasting labwork prior to follow up appt-FASTING Gastro referral Take omeprazole before biggest meal Take bp cuff at home

## 2018-10-29 NOTE — Progress Notes (Signed)
Acute Office Visit  Subjective:    Patient ID: Nicholas Vargas, male    DOB: 27-Mar-1971, 48 y.o.   MRN: [id number]  Chief Complaint  Patient presents with  . Hypertension  . Gastroesophageal Reflux    HPI Patient is in today for HTN-took medication in the past but stopped taking lisinopril/HCTZ-did not like side effects. Pt states he was seen in the ER and told he needed to start medication.No headaches, no visual changes  Past Medical History:  Diagnosis Date  . Hypertension   PMH-mother with HTN Social History   Socioeconomic History  . Marital status: Single    Spouse name: Not on file  . Number of children: Not on file  . Years of education: Not on file  . Highest education level: Not on file  Occupational History  . Not on file  Social Needs  . Financial resource strain: Not on file  . Food insecurity    Worry: Not on file    Inability: Not on file  . Transportation needs    Medical: Not on file    Non-medical: Not on file  Tobacco Use  . Smoking status: Current Some Day Smoker  . Smokeless tobacco: Never Used  . Tobacco comment: Vape  Substance and Sexual Activity  . Alcohol use: Yes  . Drug use: No  . Sexual activity: Not on file  Lifestyle  . Physical activity    Days per week: Not on file    Minutes per session: Not on file  . Stress: Not on file  Relationships  . Social Herbalist on phone: Not on file    Gets together: Not on file    Attends religious service: Not on file    Active member of club or organization: Not on file    Attends meetings of clubs or organizations: Not on file    Relationship status: Not on file  . Intimate partner violence    Fear of current or ex partner: Not on file    Emotionally abused: Not on file    Physically abused: Not on file    Forced sexual activity: Not on file  Other Topics Concern  . Not on file  Social History Narrative  . Not on file    Outpatient Medications Prior to Visit   Medication Sig Dispense Refill  . ibuprofen (ADVIL) 600 MG tablet Take 1 tablet (600 mg total) by mouth every 6 (six) hours as needed. 30 tablet 0  . lisinopril-hydrochlorothiazide (PRINZIDE,ZESTORETIC) 10-12.5 MG tablet Take 1 tablet by mouth daily. 90 tablet 3  . methocarbamol (ROBAXIN) 500 MG tablet Take 1 tablet (500 mg total) by mouth 2 (two) times daily. 10 tablet 0  . ondansetron (ZOFRAN ODT) 4 MG disintegrating tablet Take 1 tablet (4 mg total) by mouth every 8 (eight) hours as needed for nausea or vomiting. 20 tablet 0  . omeprazole (PRILOSEC) 20 MG capsule Take 1 capsule (20 mg total) by mouth daily. (Patient not taking: Reported on 10/29/2018) 30 capsule 0  . dicyclomine (BENTYL) 20 MG tablet Take 1 tablet (20 mg total) by mouth 2 (two) times daily. (Patient not taking: Reported on 10/29/2018) 20 tablet 0   No facility-administered medications prior to visit.     No Known Allergies  Review of Systems  Respiratory: Negative for cough and shortness of breath.   Cardiovascular: Negative for chest pain, palpitations and leg swelling.  Gastrointestinal: Positive for heartburn.  stool loss  Objective:    Physical Exam  Constitutional: He appears well-developed and well-nourished.  HENT:  Head: Normocephalic and atraumatic.  Eyes: Conjunctivae are normal.  Cardiovascular: Normal rate, normal heart sounds and intact distal pulses.  Pulmonary/Chest: Effort normal and breath sounds normal.  Neurological: He is alert.    BP (!) 147/82 (BP Location: Right Arm, Patient Position: Sitting, Cuff Size: Large)   Pulse 100   Temp 98.7 F (37.1 C) (Oral)   Ht '5\' 7"'$  (1.702 m)   Wt 219 lb 3.2 oz (99.4 kg)   SpO2 97%   BMI 34.33 kg/m  Wt Readings from Last 3 Encounters:  10/29/18 219 lb 3.2 oz (99.4 kg)  12/21/15 212 lb (96.2 kg)    Health Maintenance Due  Topic Date Due  . HIV Screening  11/27/1987  . TETANUS/TDAP  11/27/1991    There are no preventive care reminders to  display for this patient.   No results found for: TSH Lab Results  Component Value Date   WBC 5.1 11/23/2017   HGB 15.0 11/23/2017   HCT 47.1 11/23/2017   MCV 92.5 11/23/2017   PLT 207 11/23/2017   Lab Results  Component Value Date   NA 137 11/23/2017   K 4.6 11/23/2017   CO2 27 11/23/2017   GLUCOSE 142 (H) 11/23/2017   BUN 11 11/23/2017   CREATININE 1.23 11/23/2017   BILITOT 1.0 11/23/2017   ALKPHOS 50 11/23/2017   AST 53 (H) 11/23/2017   ALT 84 (H) 11/23/2017   PROT 7.1 11/23/2017   ALBUMIN 4.4 11/23/2017   CALCIUM 9.7 11/23/2017   ANIONGAP 9 11/23/2017      Assessment & Plan:    1. Essential hypertension - Lipid Panel; Future - CMP14+EGFR; Future - TSH; Future - Urine Microscopic; Future - Ambulatory referral to Gastroenterology - EKG 12-Lead-sr Amlodipine-rx-risk/benefit/side effects  2. Elevated glucose - Hemoglobin A1c; Future  GERD-omeprazole-rx-GI referral Avonne Berkery Hannah Beat, MD

## 2018-10-30 ENCOUNTER — Encounter: Payer: Self-pay | Admitting: Gastroenterology

## 2018-10-31 DIAGNOSIS — K219 Gastro-esophageal reflux disease without esophagitis: Secondary | ICD-10-CM | POA: Insufficient documentation

## 2018-10-31 DIAGNOSIS — I10 Essential (primary) hypertension: Secondary | ICD-10-CM | POA: Insufficient documentation

## 2018-10-31 DIAGNOSIS — R7309 Other abnormal glucose: Secondary | ICD-10-CM | POA: Insufficient documentation

## 2018-11-22 ENCOUNTER — Other Ambulatory Visit: Payer: Self-pay | Admitting: Family Medicine

## 2018-11-22 DIAGNOSIS — M545 Low back pain, unspecified: Secondary | ICD-10-CM

## 2018-11-26 ENCOUNTER — Ambulatory Visit
Admission: RE | Admit: 2018-11-26 | Discharge: 2018-11-26 | Disposition: A | Discharge status: Home / Self Care | Payer: Worker's Compensation | Source: Ambulatory Visit | Attending: Family Medicine | Admitting: Family Medicine

## 2018-11-26 ENCOUNTER — Other Ambulatory Visit: Payer: 59

## 2018-11-26 ENCOUNTER — Ambulatory Visit: Payer: 59 | Admitting: Registered Nurse

## 2018-11-26 DIAGNOSIS — M545 Low back pain, unspecified: Secondary | ICD-10-CM

## 2018-11-28 ENCOUNTER — Ambulatory Visit: Payer: 59 | Admitting: Gastroenterology

## 2018-11-29 ENCOUNTER — Encounter: Payer: Self-pay | Admitting: Gastroenterology

## 2018-11-29 ENCOUNTER — Ambulatory Visit: Payer: 59 | Admitting: Gastroenterology

## 2018-12-23 ENCOUNTER — Ambulatory Visit: Payer: 59 | Admitting: Registered Nurse

## 2018-12-24 ENCOUNTER — Encounter: Payer: Self-pay | Admitting: Registered Nurse

## 2018-12-31 ENCOUNTER — Ambulatory Visit: Payer: Self-pay | Admitting: Gastroenterology

## 2019-01-08 ENCOUNTER — Telehealth: Payer: Self-pay | Admitting: Registered Nurse

## 2019-01-08 MED ORDER — AMLODIPINE BESYLATE 5 MG PO TABS: 5.0000 mg | ORAL_TABLET | Freq: Every day | ORAL | 0 refills | Status: DC

## 2019-01-08 NOTE — Telephone Encounter (Signed)
Medication Refill - Medication: amLODipine (NORVASC) 5 MG tablet    Has the patient contacted their pharmacy? Yes.   Pt states he has no more of this medication. Please advise.  (Agent: If no, request that the patient contact the pharmacy for the refill.) (Agent: If yes, when and what did the pharmacy advise?)  Preferred Pharmacy (with phone number or street name):  [facility], [place] - [address]  [place] [address]  Phone: [phone] Fax: [phone]  Not a 24 hour pharmacy; exact hours not known.     Agent: Please be advised that RX refills may take up to 3 business days. We ask that you follow-up with your pharmacy.

## 2019-01-08 NOTE — Telephone Encounter (Signed)
Requested Prescriptions  Pending Prescriptions Disp Refills  . amLODipine (NORVASC) 5 MG tablet 30 tablet 0    Sig: Take 1 tablet (5 mg total) by mouth daily.     Cardiovascular:  Calcium Channel Blockers Failed - 01/08/2019  4:28 PM      Failed - Last BP in normal range    BP Readings from Last 1 Encounters:  10/29/18 (!) 147/82         Passed - Valid encounter within last 6 months    Recent Outpatient Visits          2 months ago Essential hypertension   [facility] [place], [place]   3 years ago Essential hypertension   [facility], [place], [place]             Phone call to patient.  Notified him that 30 day courtesy refill of medication sent.  Asked him to call and schedule follow up appointment with PCP as soon as possible.

## 2019-01-23 ENCOUNTER — Other Ambulatory Visit: Payer: Self-pay

## 2019-01-23 DIAGNOSIS — I10 Essential (primary) hypertension: Secondary | ICD-10-CM

## 2019-01-23 MED ORDER — AMLODIPINE BESYLATE 5 MG PO TABS: 5.0000 mg | ORAL_TABLET | Freq: Every day | ORAL | 0 refills | Status: DC

## 2019-01-23 NOTE — Telephone Encounter (Signed)
Patient states this medication was sent to wrong pharmacy. Please re send to   [facility] [place], [address] AT [address] [place] [phone] (Phone) [phone] (Fax)

## 2019-01-23 NOTE — Progress Notes (Signed)
Reordering to send to correct pharmacy per pt request.  Cancelled rx at [facility] pharmacy.

## 2019-01-24 ENCOUNTER — Telehealth: Payer: Self-pay | Admitting: Registered Nurse

## 2019-01-24 ENCOUNTER — Other Ambulatory Visit: Payer: Self-pay | Admitting: Adult Health Nurse Practitioner

## 2019-01-24 DIAGNOSIS — I10 Essential (primary) hypertension: Secondary | ICD-10-CM

## 2019-01-24 MED ORDER — AMLODIPINE BESYLATE 5 MG PO TABS: 5.0000 mg | ORAL_TABLET | Freq: Every day | ORAL | 0 refills | Status: DC

## 2019-01-24 NOTE — Telephone Encounter (Signed)
Pt needs [place] to resend BP amLODipine (NORVASC) 5 MG tablet [[id number] please rsend asap..     med to [facility] on [address]  /  needa to go where his other Meds are going !

## 2019-02-06 ENCOUNTER — Ambulatory Visit: Payer: Self-pay | Admitting: Gastroenterology

## 2019-02-10 ENCOUNTER — Ambulatory Visit: Payer: 59 | Admitting: Registered Nurse

## 2019-03-13 ENCOUNTER — Ambulatory Visit: Payer: Self-pay | Admitting: Gastroenterology

## 2019-03-30 IMAGING — CT CT ABD-PELV W/ CM
2 of 5 series · 16 of 46 positions shown, 18 images · IV contrast (omnipaque)
Comparison: None

CLINICAL DATA: Abdominal pain, LEFT side abdominal pain, weakness,
fatigue, and dark stools for 2 months, question diverticulitis,
history hypertension

EXAM:
CT ABDOMEN AND PELVIS WITH CONTRAST
TECHNIQUE: Multidetector CT imaging of the abdomen and pelvis was performed
using the standard protocol following bolus administration of
intravenous contrast. Sagittal and coronal MPR images reconstructed
from axial data set.
CONTRAST:  100mL OMNIPAQUE IOHEXOL 300 MG/ML SOLN IV. No oral
contrast.

[Series 3: a/p w/ 5mm · axial · 0.88mm/px · z∈[+596,+1056]mm · 13 of 104 slices shown, 15 images]
[im 6/104  soft-tissue]
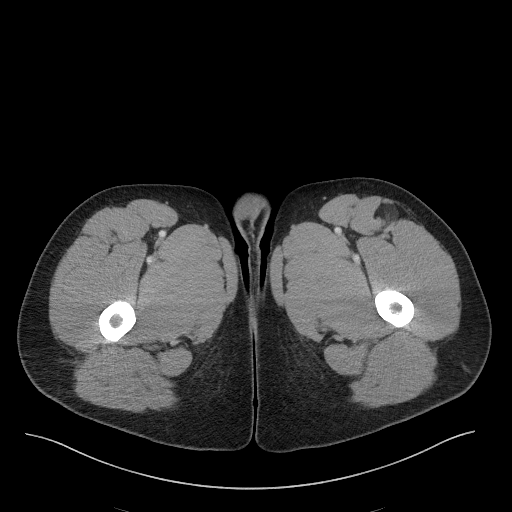
[im 6/104  bone]
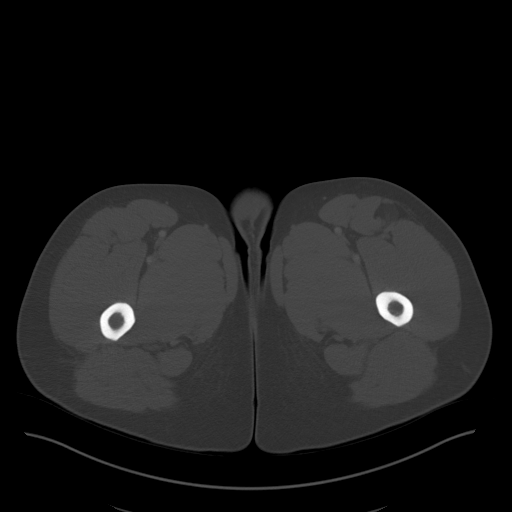
[im 17/104  soft-tissue]
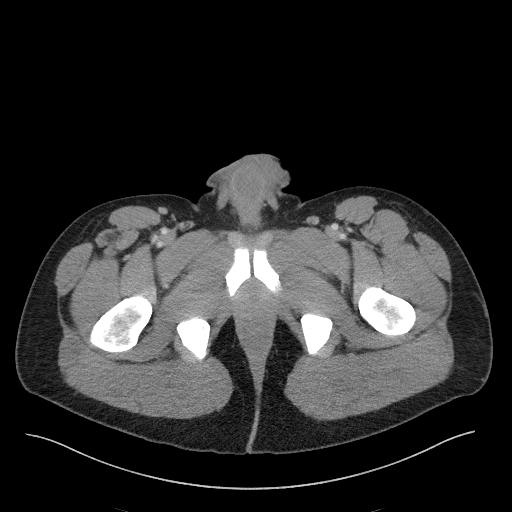
[im 22/104  soft-tissue]
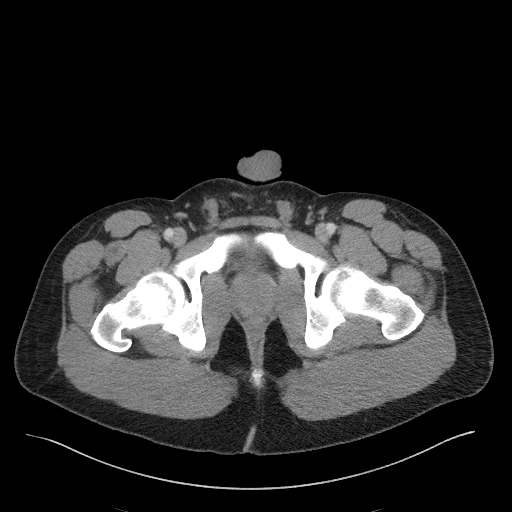
[im 28/104  soft-tissue]
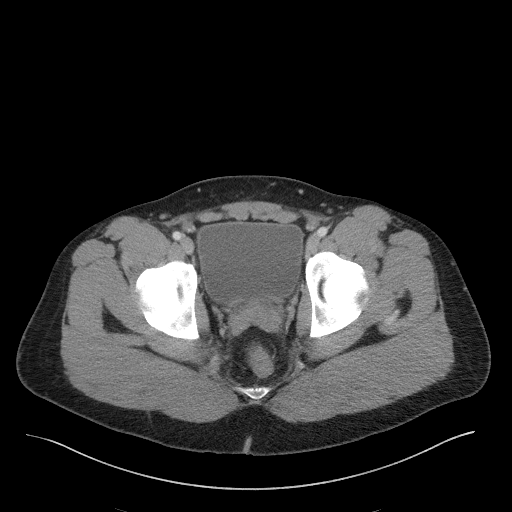
[im 38/104  soft-tissue]
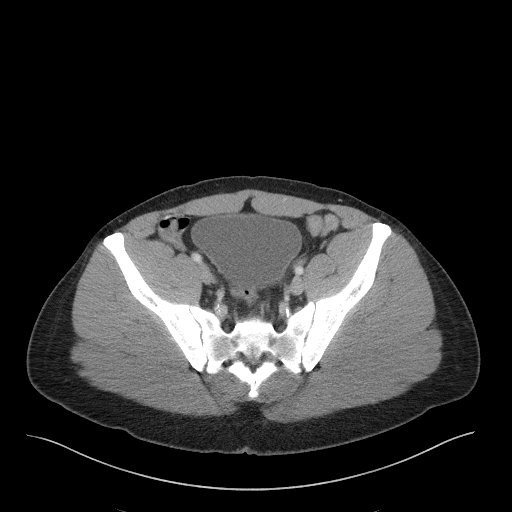
[im 44/104  soft-tissue]
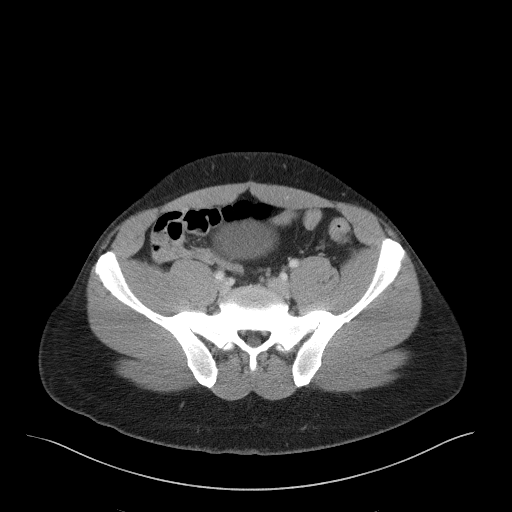
[im 55/104  soft-tissue]
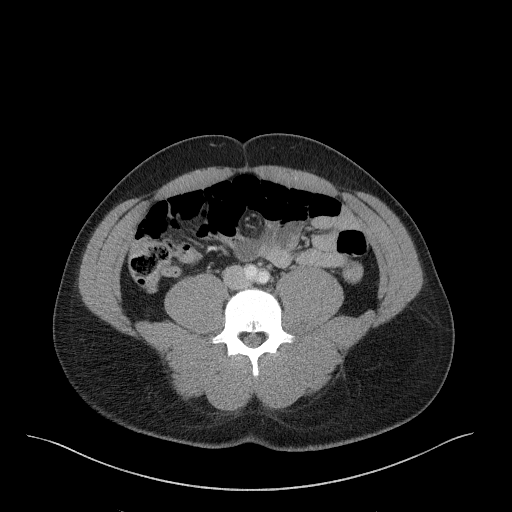
[im 60/104  soft-tissue]
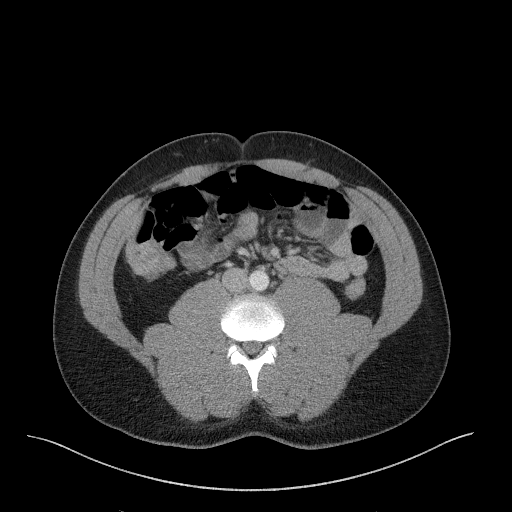
[im 66/104  soft-tissue]
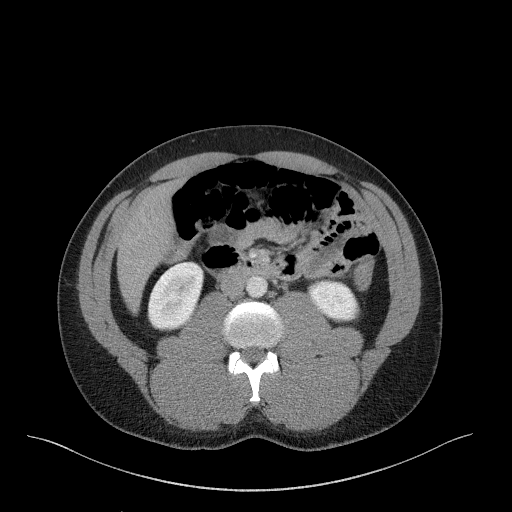
[im 66/104  bone]
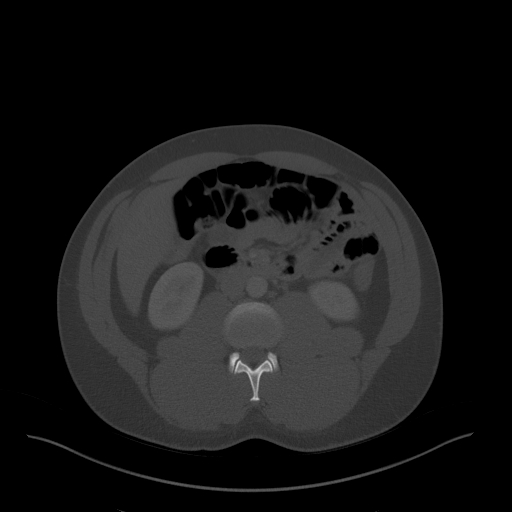
[im 76/104  soft-tissue]
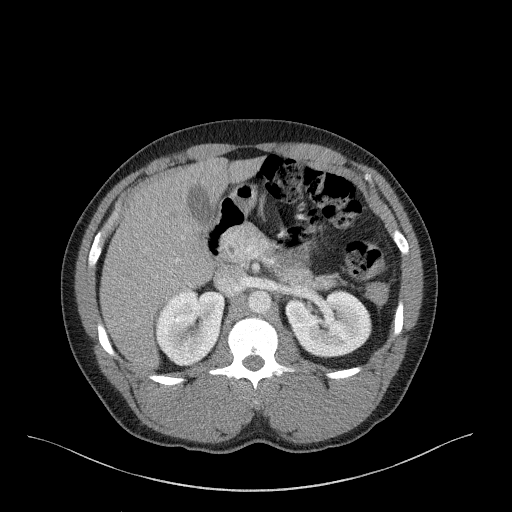
[im 82/104  soft-tissue]
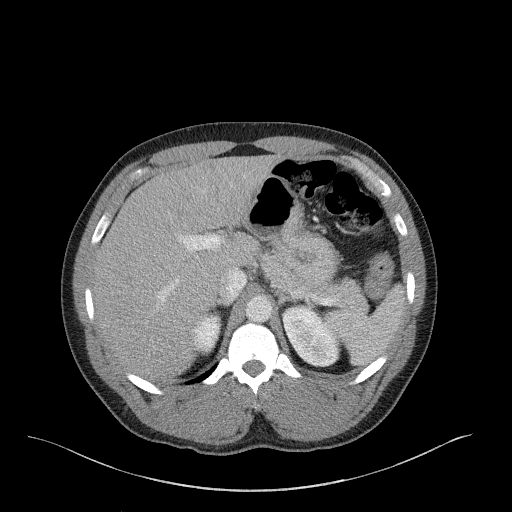
[im 87/104  soft-tissue]
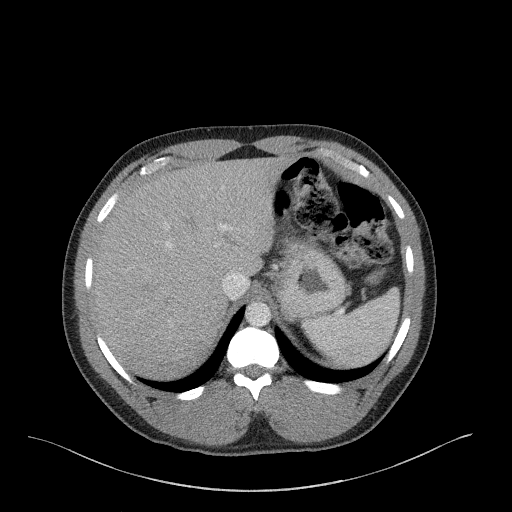
[im 98/104  soft-tissue]
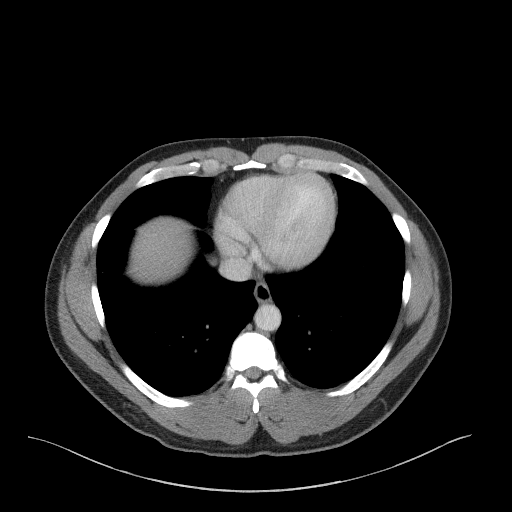

[Series 6: a/p w/ cor · coronal · 0.86mm/px · 3 of 166 slices shown]
[im 56/166  soft-tissue]
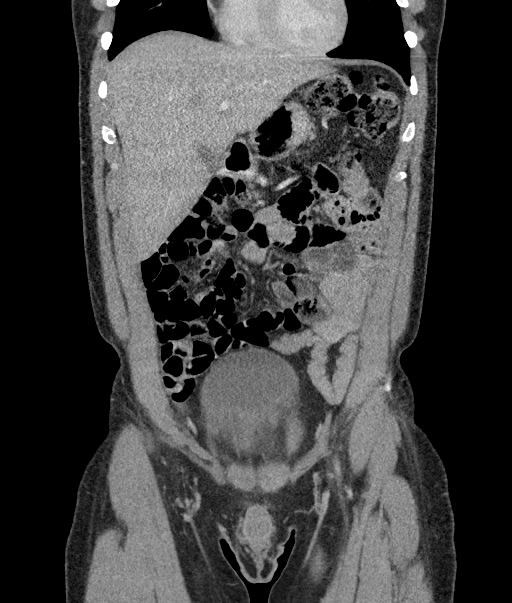
[im 74/166  soft-tissue]
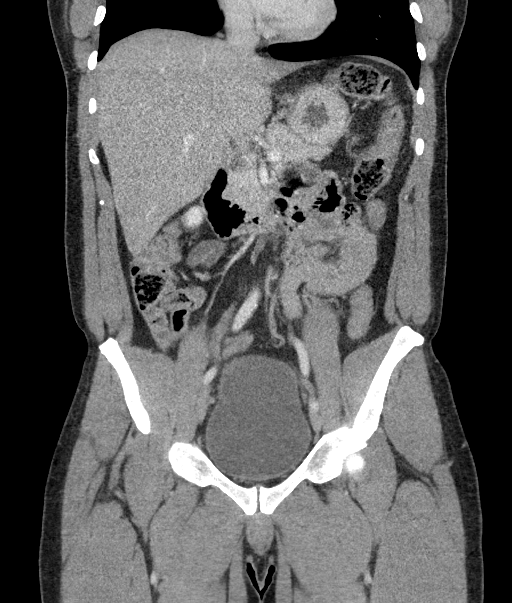
[im 92/166  soft-tissue]
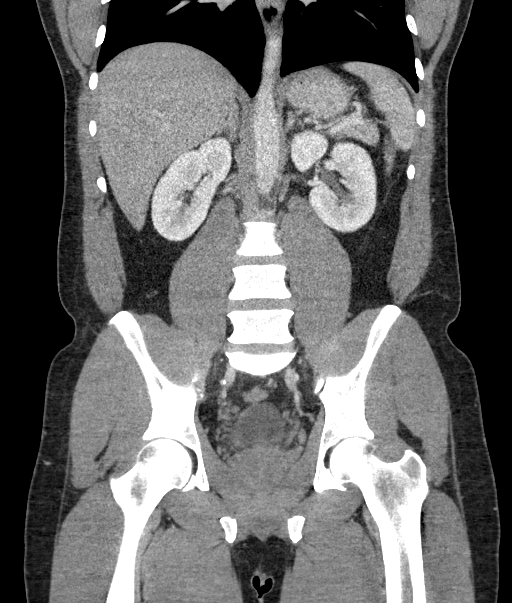

[16 of 46 positions shown; findings below may reference images not displayed]

FINDINGS: Lower chest: Lung bases clear

Hepatobiliary: Mild fatty infiltration of liver. Gallbladder and
liver otherwise normal appearance.

Pancreas: Normal appearance

Spleen: Normal appearance

Adrenals/Urinary Tract: Adrenal glands, kidneys, ureters, and
bladder normal appearance. No urinary tract calcification or
dilatation.

Stomach/Bowel: Stomach incompletely distended. Retrocecal appendix.
Bowel loops unremarkable. No colonic diverticulosis or evidence of
diverticulitis identified.

Vascular/Lymphatic: Vascular structures unremarkable. No adenopathy.

Reproductive: Minimal prostatic enlargement

Other: No free air or free fluid. Tiny RIGHT inguinal hernia
containing fat.

Musculoskeletal: Unremarkable
IMPRESSION: Tiny RIGHT inguinal hernia containing fat.

No acute intra-abdominal or intrapelvic abnormalities.

## 2019-04-10 ENCOUNTER — Telehealth: Payer: Self-pay

## 2019-04-10 NOTE — Telephone Encounter (Signed)
Pt. Called to schedule appt on 04/28/2019. Pt. Requested BP medication amlodipine be refilled to last him to the visit with Mr. Orland Mustard

## 2019-04-11 ENCOUNTER — Other Ambulatory Visit: Payer: Self-pay | Admitting: Emergency Medicine

## 2019-04-11 DIAGNOSIS — I10 Essential (primary) hypertension: Secondary | ICD-10-CM

## 2019-04-11 MED ORDER — AMLODIPINE BESYLATE 5 MG PO TABS: 5.0000 mg | ORAL_TABLET | Freq: Every day | ORAL | 0 refills | Status: DC

## 2019-04-11 NOTE — Telephone Encounter (Signed)
Amlodipine has been sent for 30 days until app.

## 2019-04-12 ENCOUNTER — Other Ambulatory Visit: Payer: Self-pay | Admitting: Registered Nurse

## 2019-04-12 DIAGNOSIS — I10 Essential (primary) hypertension: Secondary | ICD-10-CM

## 2019-04-28 ENCOUNTER — Ambulatory Visit: Payer: 59 | Admitting: Registered Nurse

## 2019-04-29 ENCOUNTER — Ambulatory Visit: Payer: Self-pay | Admitting: Gastroenterology

## 2019-05-06 ENCOUNTER — Ambulatory Visit: Payer: 59 | Admitting: Registered Nurse

## 2019-05-16 ENCOUNTER — Other Ambulatory Visit: Payer: Self-pay | Admitting: Registered Nurse

## 2019-05-16 DIAGNOSIS — I10 Essential (primary) hypertension: Secondary | ICD-10-CM

## 2019-05-16 NOTE — Telephone Encounter (Signed)
Courtesy refill. Pt has appt in 3 days.

## 2019-05-19 ENCOUNTER — Ambulatory Visit: Payer: 59 | Admitting: Registered Nurse

## 2019-05-20 ENCOUNTER — Encounter: Payer: Self-pay | Admitting: Registered Nurse

## 2019-05-21 ENCOUNTER — Telehealth: Payer: Self-pay

## 2019-05-21 NOTE — Telephone Encounter (Signed)
Called patient to tell him his refill for omeprazole was denied and that he needs an appointment for any additional refills.

## 2019-06-04 ENCOUNTER — Ambulatory Visit: Payer: Self-pay | Admitting: Registered Nurse

## 2019-06-06 ENCOUNTER — Ambulatory Visit: Payer: 59 | Admitting: Gastroenterology

## 2019-06-13 ENCOUNTER — Telehealth (INDEPENDENT_AMBULATORY_CARE_PROVIDER_SITE_OTHER): Payer: 59 | Admitting: Registered Nurse

## 2019-06-13 ENCOUNTER — Other Ambulatory Visit: Payer: Self-pay

## 2019-06-13 DIAGNOSIS — K219 Gastro-esophageal reflux disease without esophagitis: Secondary | ICD-10-CM | POA: Diagnosis not present

## 2019-06-13 DIAGNOSIS — I10 Essential (primary) hypertension: Secondary | ICD-10-CM | POA: Diagnosis not present

## 2019-06-13 DIAGNOSIS — Z760 Encounter for issue of repeat prescription: Secondary | ICD-10-CM | POA: Diagnosis not present

## 2019-06-13 MED ORDER — OMEPRAZOLE 20 MG PO CPDR: DELAYED_RELEASE_CAPSULE | ORAL | 5 refills | Status: DC

## 2019-06-13 MED ORDER — AMLODIPINE BESYLATE 5 MG PO TABS: ORAL_TABLET | ORAL | 0 refills | Status: DC

## 2019-06-13 NOTE — Progress Notes (Signed)
Patient states he needs a medication refill for Amlodipine, Omeprazole those are the only two he can think of at the top of his head.Patient wanted to know if he could discuss a gum infection from getting his front teeth pulled and its draining pus and making his breathe smell bad.

## 2019-06-13 NOTE — Progress Notes (Signed)
Telemedicine Encounter- SOAP NOTE Established Patient  This telephone encounter was conducted with the patient's (or proxy's) verbal consent via audio telecommunications: yes  Patient was instructed to have this encounter in a suitably private space; and to only have persons present to whom they give permission to participate. In addition, patient identity was confirmed by use of name plus two identifiers (DOB and address).  I discussed the limitations, risks, security and privacy concerns of performing an evaluation and management service by telephone and the availability of in person appointments. I also discussed with the patient that there may be a patient responsible charge related to this service. The patient expressed understanding and agreed to proceed.  I spent a total of 11 minutes talking with the patient or their proxy.  No chief complaint on file.   Subjective   Nicholas Vargas is a 49 y.o. established patient. Telephone visit today for medication refills  HPI Pt was seen by Dr. Judee Clara in July 2020 - started on amlodipine 5mg  for HTN. Was supposed to follow up with myself in 3 months for visit to establish care - unfortunately has not yet made that appointment.   Denies CV symptoms including headache, visual changes, fatigue, shob, doe, dependent edema. Feels well overall. Does not check BP at home. Denies recent change in weight.   Also taking omeprazole 20mg  PO qd for GERD. Has run out, GERD is worsening. States that he cannot identify triggers. Omeprazole has been very effective. No hemoptysis or globus sensation, no dysphagia.  Patient Active Problem List   Diagnosis Date Noted  . Essential hypertension 10/31/2018  . Elevated glucose 10/31/2018  . Gastroesophageal reflux disease 10/31/2018    Past Medical History:  Diagnosis Date  . Hypertension     Current Outpatient Medications  Medication Sig Dispense Refill  . amLODipine (NORVASC) 5 MG tablet  TAKE 1 TABLET(5 MG) BY MOUTH DAILY 90 tablet 0  . ibuprofen (ADVIL) 600 MG tablet Take 1 tablet (600 mg total) by mouth every 6 (six) hours as needed. 30 tablet 0  . methocarbamol (ROBAXIN) 500 MG tablet Take 1 tablet (500 mg total) by mouth 2 (two) times daily. 10 tablet 0  . omeprazole (PRILOSEC) 20 MG capsule Take 1 hour prior to largest meal daily 30 capsule 5  . ondansetron (ZOFRAN ODT) 4 MG disintegrating tablet Take 1 tablet (4 mg total) by mouth every 8 (eight) hours as needed for nausea or vomiting. 20 tablet 0   No current facility-administered medications for this visit.    No Known Allergies  Social History   Socioeconomic History  . Marital status: Single    Spouse name: Not on file  . Number of children: Not on file  . Years of education: Not on file  . Highest education level: Not on file  Occupational History  . Not on file  Tobacco Use  . Smoking status: Current Some Day Smoker  . Smokeless tobacco: Never Used  . Tobacco comment: Vape  Substance and Sexual Activity  . Alcohol use: Yes  . Drug use: No  . Sexual activity: Not on file  Other Topics Concern  . Not on file  Social History Narrative  . Not on file   Social Determinants of Health   Financial Resource Strain:   . Difficulty of Paying Living Expenses: Not on file  Food Insecurity:   . Worried About 01/01/2019 in the Last Year: Not on file  . Ran Out of  Food in the Last Year: Not on file  Transportation Needs:   . Lack of Transportation (Medical): Not on file  . Lack of Transportation (Non-Medical): Not on file  Physical Activity:   . Days of Exercise per Week: Not on file  . Minutes of Exercise per Session: Not on file  Stress:   . Feeling of Stress : Not on file  Social Connections:   . Frequency of Communication with Friends and Family: Not on file  . Frequency of Social Gatherings with Friends and Family: Not on file  . Attends Religious Services: Not on file  . Active Member  of Clubs or Organizations: Not on file  . Attends Archivist Meetings: Not on file  . Marital Status: Not on file  Intimate Partner Violence:   . Fear of Current or Ex-Partner: Not on file  . Emotionally Abused: Not on file  . Physically Abused: Not on file  . Sexually Abused: Not on file    ROS Per hpi   Objective   Vitals as reported by the patient: There were no vitals filed for this visit.  Diagnoses and all orders for this visit:  Gastroesophageal reflux disease, unspecified whether esophagitis present -     omeprazole (PRILOSEC) 20 MG capsule; Take 1 hour prior to largest meal daily  Essential hypertension -     amLODipine (NORVASC) 5 MG tablet; TAKE 1 TABLET(5 MG) BY MOUTH DAILY   PLAN  Refill amlodipine and omeprazole.  He will present for labs (orders in) within 1-2 weeks. He understands that he will need to present for these labs in order to receive further care. Additionally, he will need vitals at that visit. We will plan follow up based on labs and vitals.  Overall seems to be doing well. Suffered back injury at work, going through work for PT  Patient encouraged to call clinic with any questions, comments, or concerns.   I discussed the assessment and treatment plan with the patient. The patient was provided an opportunity to ask questions and all were answered. The patient agreed with the plan and demonstrated an understanding of the instructions.   The patient was advised to call back or seek an in-person evaluation if the symptoms worsen or if the condition fails to improve as anticipated.  I provided 11 minutes of non-face-to-face time during this encounter.  Maximiano Coss, NP  Primary Care at [facility]

## 2019-06-16 ENCOUNTER — Telehealth: Payer: Self-pay | Admitting: Registered Nurse

## 2019-06-16 NOTE — Telephone Encounter (Signed)
Pt called about not receiving the antibiotics for his gums/ Pt would like to know If this can be called in asap/ please advise

## 2019-06-17 ENCOUNTER — Other Ambulatory Visit: Payer: Self-pay | Admitting: Registered Nurse

## 2019-06-17 DIAGNOSIS — K047 Periapical abscess without sinus: Secondary | ICD-10-CM

## 2019-06-17 MED ORDER — DOXYCYCLINE HYCLATE 100 MG PO TABS: 100.0000 mg | ORAL_TABLET | Freq: Two times a day (BID) | ORAL | 0 refills | Status: DC

## 2019-06-17 NOTE — Telephone Encounter (Signed)
Please Advise

## 2019-06-17 NOTE — Telephone Encounter (Signed)
I have sent it  Thank you  Jari Sportsman, NP

## 2019-06-24 ENCOUNTER — Ambulatory Visit: Payer: 59 | Admitting: Registered Nurse

## 2019-07-01 ENCOUNTER — Other Ambulatory Visit: Payer: Self-pay

## 2019-07-01 ENCOUNTER — Encounter: Payer: Self-pay | Admitting: Registered Nurse

## 2019-07-01 ENCOUNTER — Ambulatory Visit: Payer: 59 | Admitting: Registered Nurse

## 2019-07-01 VITALS — BP 148/101 | HR 88 | Temp 98.1°F | Ht 67.0 in | Wt 238.4 lb

## 2019-07-01 DIAGNOSIS — K219 Gastro-esophageal reflux disease without esophagitis: Secondary | ICD-10-CM | POA: Diagnosis not present

## 2019-07-01 DIAGNOSIS — I10 Essential (primary) hypertension: Secondary | ICD-10-CM

## 2019-07-01 DIAGNOSIS — Z130 Encounter for screening for diseases of the blood and blood-forming organs and certain disorders involving the immune mechanism: Secondary | ICD-10-CM

## 2019-07-01 DIAGNOSIS — Z1329 Encounter for screening for other suspected endocrine disorder: Secondary | ICD-10-CM

## 2019-07-01 DIAGNOSIS — Z13228 Encounter for screening for other metabolic disorders: Secondary | ICD-10-CM

## 2019-07-01 DIAGNOSIS — Z13220 Encounter for screening for lipoid disorders: Secondary | ICD-10-CM

## 2019-07-01 DIAGNOSIS — F410 Panic disorder [episodic paroxysmal anxiety] without agoraphobia: Secondary | ICD-10-CM

## 2019-07-01 DIAGNOSIS — F418 Other specified anxiety disorders: Secondary | ICD-10-CM

## 2019-07-01 MED ORDER — OMEPRAZOLE 20 MG PO CPDR: DELAYED_RELEASE_CAPSULE | ORAL | 3 refills | Status: DC

## 2019-07-01 MED ORDER — BUSPIRONE HCL 5 MG PO TABS: 5.0000 mg | ORAL_TABLET | Freq: Three times a day (TID) | ORAL | 0 refills | Status: DC

## 2019-07-01 MED ORDER — ALPRAZOLAM 0.5 MG PO TBDP: 0.5000 mg | ORAL_TABLET | Freq: Every evening | ORAL | 0 refills | Status: DC | PRN

## 2019-07-01 MED ORDER — AMLODIPINE BESYLATE 10 MG PO TABS: 10.0000 mg | ORAL_TABLET | Freq: Every day | ORAL | 3 refills | Status: DC

## 2019-07-01 MED ORDER — BUPROPION HCL ER (SR) 100 MG PO TB12: 100.0000 mg | ORAL_TABLET | Freq: Two times a day (BID) | ORAL | 0 refills | Status: DC

## 2019-07-01 NOTE — Patient Instructions (Addendum)
Mr. Rathgeber -  Randie Heinz to meet you in person today.  Briefly, here's what we have for medications:  Amlodipine: Increase dose to 10mg  daily. This is for your blood pressure. This is best taken in the morning.  Omeprazole: Take 20mg  by mouth first thing in the morning daily for at least two weeks with heartburn / acid reflux.  Wellbutrin (Bupropion): Take 100mg  in the morning for one week. Then increase this to 100mg  in the morning and 100mg  in the evening. This medication will be the mainstay of your treatment. I expect that it will take around 4 weeks to show full effect. There should not be any major side effects, but if you have concerns, let me know.   BuSpar (Buspirone): Take 5mg  up to three times daily as needed for anxiety. This can be helpful for anxiety that occurs throughout the work day or times when you need to be productive.  Xanax (Alprazolam): Take 0.5mg  sublingually once each night as needed for nighttime anxiety and sleep disturbance. This is not a medication we're planning on keeping you on long term.   I'd like to see you again in about 4 weeks (this can be over the phone or video chat) to discuss how well these medications are working. If you have any issues in the mean time, please let me know.   If you have lab work done today you will be contacted with your lab results within the next 2 weeks.  If you have not heard from then please contact . The fastest way to get your results is to register for My Chart.   IF you received an x-ray today, you will receive an invoice from [facility]. Please contact [facility] at [phone] with questions or concerns regarding your invoice.   IF you received labwork today, you will receive an invoice from [facility]. Please contact [facility] at [phone] with questions or concerns regarding your invoice.   Our billing staff will not be able to assist you with questions regarding bills from these  companies.  You will be contacted with the lab results as soon as they are available. The fastest way to get your results is to activate your My Chart account. Instructions are located on the last page of this paperwork. If you have not heard from [place] regarding the results in 2 weeks, please contact this office.

## 2019-07-01 NOTE — Progress Notes (Signed)
Established Patient Office Visit  Subjective:  Patient ID: Nicholas Vargas, male    DOB: Aug 26, 1970  Age: 49 y.o. MRN: [id number]  CC:  Chief Complaint  Patient presents with  . Anxiety    patient has been having some depression and anixety for a while but getting worse. PHQ9=17 GAD=13  . Medication Refill    all medications    HPI [facility] presents for med refills and anxiety / depression  Needs refills on amlodipine and omeprazole. Tolerating both well, however, BP elevated today. Will increase amlodipine to '10mg'$  and he will return for bp check in 2 weeks  Notes that anxiety and depression has been a worsening problem for him since a work accident in May of 2020. Has felt withdrawn, often anxious, often tearful. Denies HI/SI. Has been in counseling for 10 mos. Feels that medication may be warranted, but concerned about weight gain as a side effect.  Otherwise feeling well   Past Medical History:  Diagnosis Date  . Depression   . Hypertension     No past surgical history on file.  No family history on file.  Social History   Socioeconomic History  . Marital status: Single    Spouse name: Not on file  . Number of children: Not on file  . Years of education: Not on file  . Highest education level: Not on file  Occupational History  . Not on file  Tobacco Use  . Smoking status: Current Some Day Smoker  . Smokeless tobacco: Never Used  . Tobacco comment: Vape  Substance and Sexual Activity  . Alcohol use: Yes  . Drug use: No  . Sexual activity: Not on file  Other Topics Concern  . Not on file  Social History Narrative  . Not on file   Social Determinants of Health   Financial Resource Strain:   . Difficulty of Paying Living Expenses: Not on file  Food Insecurity:   . Worried About Charity fundraiser in the Last Year: Not on file  . Ran Out of Food in the Last Year: Not on file  Transportation Needs:   . Lack of Transportation  (Medical): Not on file  . Lack of Transportation (Non-Medical): Not on file  Physical Activity:   . Days of Exercise per Week: Not on file  . Minutes of Exercise per Session: Not on file  Stress:   . Feeling of Stress : Not on file  Social Connections:   . Frequency of Communication with Friends and Family: Not on file  . Frequency of Social Gatherings with Friends and Family: Not on file  . Attends Religious Services: Not on file  . Active Member of Clubs or Organizations: Not on file  . Attends Archivist Meetings: Not on file  . Marital Status: Not on file  Intimate Partner Violence:   . Fear of Current or Ex-Partner: Not on file  . Emotionally Abused: Not on file  . Physically Abused: Not on file  . Sexually Abused: Not on file    Outpatient Medications Prior to Visit  Medication Sig Dispense Refill  . doxycycline (VIBRA-TABS) 100 MG tablet Take 1 tablet (100 mg total) by mouth 2 (two) times daily. 20 tablet 0  . ibuprofen (ADVIL) 600 MG tablet Take 1 tablet (600 mg total) by mouth every 6 (six) hours as needed. 30 tablet 0  . methocarbamol (ROBAXIN) 500 MG tablet Take 1 tablet (500 mg total) by mouth 2 (two)  times daily. 10 tablet 0  . ondansetron (ZOFRAN ODT) 4 MG disintegrating tablet Take 1 tablet (4 mg total) by mouth every 8 (eight) hours as needed for nausea or vomiting. 20 tablet 0  . amLODipine (NORVASC) 5 MG tablet TAKE 1 TABLET(5 MG) BY MOUTH DAILY 90 tablet 0  . omeprazole (PRILOSEC) 20 MG capsule Take 1 hour prior to largest meal daily 30 capsule 5   No facility-administered medications prior to visit.    No Known Allergies  ROS Review of Systems  Constitutional: Negative.   HENT: Negative.   Eyes: Negative.   Respiratory: Negative.   Cardiovascular: Negative.   Gastrointestinal: Negative.   Endocrine: Negative.   Genitourinary: Negative.   Musculoskeletal: Negative.   Skin: Negative.   Allergic/Immunologic: Negative.   Neurological:  Negative.   Hematological: Negative.   Psychiatric/Behavioral: Positive for decreased concentration and dysphoric mood. Negative for agitation, behavioral problems, confusion, hallucinations, self-injury, sleep disturbance and suicidal ideas. The patient is nervous/anxious. The patient is not hyperactive.   All other systems reviewed and are negative.     Objective:    Physical Exam  Constitutional: He is oriented to person, place, and time. He appears well-developed and well-nourished. No distress.  Cardiovascular: Normal rate, regular rhythm and normal heart sounds. Exam reveals no gallop and no friction rub.  No murmur heard. Pulmonary/Chest: Effort normal and breath sounds normal. No respiratory distress. He has no wheezes. He has no rales. He exhibits no tenderness.  Neurological: He is alert and oriented to person, place, and time.  Skin: Skin is warm and dry. No rash noted. He is not diaphoretic. No erythema. No pallor.  Psychiatric: He has a normal mood and affect. His behavior is normal. Judgment and thought content normal.  Nursing note and vitals reviewed.   BP (!) 148/101   Pulse 88   Temp 98.1 F (36.7 C) (Temporal)   Ht '5\' 7"'$  (1.702 m)   Wt 238 lb 6.4 oz (108.1 kg) Comment: 238.4lb  SpO2 97%   BMI 37.34 kg/m  Wt Readings from Last 3 Encounters:  07/01/19 238 lb 6.4 oz (108.1 kg)  10/29/18 219 lb 3.2 oz (99.4 kg)  12/21/15 212 lb (96.2 kg)     Health Maintenance Due  Topic Date Due  . HIV Screening  11/27/1987  . TETANUS/TDAP  11/27/1991    There are no preventive care reminders to display for this patient.  No results found for: TSH Lab Results  Component Value Date   WBC 5.1 11/23/2017   HGB 15.0 11/23/2017   HCT 47.1 11/23/2017   MCV 92.5 11/23/2017   PLT 207 11/23/2017   Lab Results  Component Value Date   NA 137 11/23/2017   K 4.6 11/23/2017   CO2 27 11/23/2017   GLUCOSE 142 (H) 11/23/2017   BUN 11 11/23/2017   CREATININE 1.23 11/23/2017     BILITOT 1.0 11/23/2017   ALKPHOS 50 11/23/2017   AST 53 (H) 11/23/2017   ALT 84 (H) 11/23/2017   PROT 7.1 11/23/2017   ALBUMIN 4.4 11/23/2017   CALCIUM 9.7 11/23/2017   ANIONGAP 9 11/23/2017   No results found for: CHOL No results found for: HDL No results found for: LDLCALC No results found for: TRIG No results found for: CHOLHDL No results found for: HGBA1C    Assessment & Plan:   Problem List Items Addressed This Visit      Cardiovascular and Mediastinum   Essential hypertension   Relevant Medications   amLODipine (  NORVASC) 10 MG tablet     Digestive   Gastroesophageal reflux disease   Relevant Medications   omeprazole (PRILOSEC) 20 MG capsule    Other Visit Diagnoses    Screening for endocrine, metabolic and immunity disorder    -  Primary   Relevant Orders   TSH   CMP14+EGFR   Hemoglobin A1c   CBC   Lipid screening       Relevant Orders   Lipid panel   Panic attacks       Relevant Medications   buPROPion (WELLBUTRIN SR) 100 MG 12 hr tablet   busPIRone (BUSPAR) 5 MG tablet   ALPRAZolam (NIRAVAM) 0.5 MG dissolvable tablet   Depression with anxiety       Relevant Medications   buPROPion (WELLBUTRIN SR) 100 MG 12 hr tablet   busPIRone (BUSPAR) 5 MG tablet   ALPRAZolam (NIRAVAM) 0.5 MG dissolvable tablet      Meds ordered this encounter  Medications  . amLODipine (NORVASC) 10 MG tablet    Sig: Take 1 tablet (10 mg total) by mouth daily.    Dispense:  90 tablet    Refill:  3    Order Specific Question:   Supervising Provider    Answer:   Delia Chimes A [id number]  . omeprazole (PRILOSEC) 20 MG capsule    Sig: Take 1 hour prior to largest meal daily    Dispense:  90 capsule    Refill:  3    Order Specific Question:   Supervising Provider    Answer:   Delia Chimes A [id number]  . buPROPion (WELLBUTRIN SR) 100 MG 12 hr tablet    Sig: Take 1 tablet (100 mg total) by mouth 2 (two) times daily.    Dispense:  90 tablet    Refill:  0    Order  Specific Question:   Supervising Provider    Answer:   Delia Chimes A [id number]  . busPIRone (BUSPAR) 5 MG tablet    Sig: Take 1 tablet (5 mg total) by mouth 3 (three) times daily.    Dispense:  90 tablet    Refill:  0    Order Specific Question:   Supervising Provider    Answer:   Delia Chimes A [id number]  . ALPRAZolam (NIRAVAM) 0.5 MG dissolvable tablet    Sig: Take 1 tablet (0.5 mg total) by mouth at bedtime as needed for anxiety.    Dispense:  30 tablet    Refill:  0    Order Specific Question:   Supervising Provider    Answer:   Forrest Moron [id number]    Follow-up: No follow-ups on file.   PLAN  Increase amlodipine to '10mg'$  PO qd  Start bupropion '100mg'$  SR once daily for one week, then increase to twice daily.  Buspar '5mg'$  PO tid PRN for breakthrough anxiety  Alprazolam 0.5 mg sublingual qhs PRN for nighttime anxiety with sleep disturbance  Return in 1 mo for med check  Patient encouraged to call clinic with any questions, comments, or concerns.  Maximiano Coss, NP

## 2019-07-02 ENCOUNTER — Other Ambulatory Visit: Payer: Self-pay | Admitting: Registered Nurse

## 2019-07-02 DIAGNOSIS — E782 Mixed hyperlipidemia: Secondary | ICD-10-CM

## 2019-07-02 LAB — CBC
Hematocrit: 43.3 % (ref 37.5–51.0)
Hemoglobin: 14.6 g/dL (ref 13.0–17.7)
MCH: 30.4 pg (ref 26.6–33.0)
MCHC: 33.7 g/dL (ref 31.5–35.7)
MCV: 90 fL (ref 79–97)
Platelets: 201 10*3/uL (ref 150–450)
RBC: 4.8 x10E6/uL (ref 4.14–5.80)
RDW: 13.3 % (ref 11.6–15.4)
WBC: 4.7 10*3/uL (ref 3.4–10.8)

## 2019-07-02 LAB — CMP14+EGFR
ALT: 64 IU/L — ABNORMAL HIGH (ref 0–44)
AST: 41 IU/L — ABNORMAL HIGH (ref 0–40)
Albumin/Globulin Ratio: 2 (ref 1.2–2.2)
Albumin: 4.8 g/dL (ref 4.0–5.0)
Alkaline Phosphatase: 60 IU/L (ref 39–117)
BUN/Creatinine Ratio: 13 (ref 9–20)
BUN: 15 mg/dL (ref 6–24)
Bilirubin Total: 0.4 mg/dL (ref 0.0–1.2)
CO2: 23 mmol/L (ref 20–29)
Calcium: 9.8 mg/dL (ref 8.7–10.2)
Chloride: 100 mmol/L (ref 96–106)
Creatinine, Ser: 1.12 mg/dL (ref 0.76–1.27)
GFR calc Af Amer: 91 mL/min/{1.73_m2} (ref >59)
GFR calc non Af Amer: 78 mL/min/{1.73_m2} (ref >59)
Globulin, Total: 2.4 g/dL (ref 1.5–4.5)
Glucose: 105 mg/dL — ABNORMAL HIGH (ref 65–99)
Potassium: 4.7 mmol/L (ref 3.5–5.2)
Sodium: 137 mmol/L (ref 134–144)
Total Protein: 7.2 g/dL (ref 6.0–8.5)

## 2019-07-02 LAB — LIPID PANEL
Chol/HDL Ratio: 5.5 ratio — ABNORMAL HIGH (ref 0.0–5.0)
Cholesterol, Total: 270 mg/dL — ABNORMAL HIGH (ref 100–199)
HDL: 49 mg/dL (ref >39)
LDL Chol Calc (NIH): 180 mg/dL — ABNORMAL HIGH (ref 0–99)
Triglycerides: 216 mg/dL — ABNORMAL HIGH (ref 0–149)
VLDL Cholesterol Cal: 41 mg/dL — ABNORMAL HIGH (ref 5–40)

## 2019-07-02 LAB — TSH: TSH: 1.69 u[IU]/mL (ref 0.450–4.500)

## 2019-07-02 LAB — HEMOGLOBIN A1C
Est. average glucose Bld gHb Est-mCnc: 120 mg/dL
Hgb A1c MFr Bld: 5.8 % — ABNORMAL HIGH (ref 4.8–5.6)

## 2019-07-02 MED ORDER — ATORVASTATIN CALCIUM 40 MG PO TABS: 40.0000 mg | ORAL_TABLET | Freq: Every day | ORAL | 1 refills | Status: DC

## 2019-07-02 NOTE — Progress Notes (Signed)
Good evening,  If we could call Nicholas Vargas to talk about lab results, that would be great.  His A1c and lipids are up. He ideally needs to work on his diet and exercise to control these Additionally, I want him to start on 20mg  atorvastatin by mouth with dinner. I don't anticipate side effects, but if he has any, stop taking the medication and call me.  Thank you  , NP

## 2019-07-08 ENCOUNTER — Encounter: Payer: Self-pay | Admitting: Radiology

## 2019-07-08 NOTE — Progress Notes (Signed)
Yes please -   Thank you,  Jari Sportsman, NP

## 2019-07-11 ENCOUNTER — Other Ambulatory Visit: Payer: Self-pay | Admitting: Registered Nurse

## 2019-07-11 DIAGNOSIS — K047 Periapical abscess without sinus: Secondary | ICD-10-CM

## 2019-07-11 MED ORDER — DOXYCYCLINE HYCLATE 100 MG PO TABS: 100.0000 mg | ORAL_TABLET | Freq: Two times a day (BID) | ORAL | 0 refills | Status: DC

## 2019-07-11 NOTE — Telephone Encounter (Signed)
Patient is requesting a refill of the following medications: Requested Prescriptions   Pending Prescriptions Disp Refills  . doxycycline (VIBRA-TABS) 100 MG tablet 20 tablet 0    Sig: Take 1 tablet (100 mg total) by mouth 2 (two) times daily.    Date of patient request: 07/11/2019 Last office visit: 07/01/2019 Date of last refill: 06/17/2019 Last refill amount: 20 tablets  Follow up time period per chart: no follow up scheduled

## 2019-07-11 NOTE — Telephone Encounter (Signed)
Notified pt in regards to an antibiotic that he is requesting. He did not know the name but looking in his chart, Doxycycline 100 mg tabs was prescribed on 06/17/19.  He stated that he needs it for his dental problem and has an appointment with the dentist but it is not soon. Routing to PCP for review and recommendation.

## 2019-07-11 NOTE — Telephone Encounter (Signed)
Copied from CRM [id number]. Topic: Quick Communication - Rx Refill/Question >> Jul 11, 2019  1:26 PM Sedonia Small, [place] A wrote: Medication: Patient requesting refill on antibiotic (Patient didn't remember the name of medication and is requesting a callback from nurse once medication has been sent t pharmacy)  Has the patient contacted their pharmacy? Yes (Agent: If no, request that the patient contact the pharmacy for the refill.) (Agent: If yes, when and what did the pharmacy advise?)Contact PCP  Preferred Pharmacy (with phone number or street name):[facility] [id number] - [place], [place] - [address] AT [facility] [address] [facility] [address]  Phone:  [phone] Fax:  [phone]     Agent: Please be advised that RX refills may take up to 3 business days. We ask that you follow-up with your pharmacy.

## 2019-07-22 ENCOUNTER — Telehealth (INDEPENDENT_AMBULATORY_CARE_PROVIDER_SITE_OTHER): Payer: 59 | Admitting: Registered Nurse

## 2019-07-22 ENCOUNTER — Telehealth: Payer: Self-pay

## 2019-07-22 ENCOUNTER — Encounter: Payer: Self-pay | Admitting: Registered Nurse

## 2019-07-22 ENCOUNTER — Other Ambulatory Visit: Payer: Self-pay

## 2019-07-22 DIAGNOSIS — Z719 Counseling, unspecified: Secondary | ICD-10-CM

## 2019-07-22 NOTE — Telephone Encounter (Signed)
Practice has attempted to return pt call several times. Left several messages

## 2019-07-22 NOTE — Patient Instructions (Signed)
° ° ° °  If you have lab work done today you will be contacted with your lab results within the next 2 weeks.  If you have not heard from us then please contact us. The fastest way to get your results is to register for My Chart. ° ° °IF you received an x-ray today, you will receive an invoice from [facility]. Please contact [facility] at [phone] with questions or concerns regarding your invoice.  ° °IF you received labwork today, you will receive an invoice from [facility]. Please contact [facility] at [phone] with questions or concerns regarding your invoice.  ° °Our billing staff will not be able to assist you with questions regarding bills from these companies. ° °You will be contacted with the lab results as soon as they are available. The fastest way to get your results is to activate your My Chart account. Instructions are located on the last page of this paperwork. If you have not heard from us regarding the results in 2 weeks, please contact this office. °  ° ° ° °

## 2019-08-03 NOTE — Progress Notes (Signed)
Telemedicine Encounter- SOAP NOTE Established Patient  This telephone encounter was conducted with the patient's (or proxy's) verbal consent via audio telecommunications: yes  Patient was instructed to have this encounter in a suitably private space; and to only have persons present to whom they give permission to participate. In addition, patient identity was confirmed by use of name plus two identifiers (DOB and address).  I discussed the limitations, risks, security and privacy concerns of performing an evaluation and management service by telephone and the availability of in person appointments. I also discussed with the patient that there may be a patient responsible charge related to this service. The patient expressed understanding and agreed to proceed.  I spent a total of 16 minutes talking with the patient or their proxy.  Chief Complaint  Patient presents with  . Follow-up    to discuss his lab results with the provider , and see if there is any changes that needs to be made by him    Subjective   Nicholas Vargas is a 49 y.o. established patient. Telephone visit today for review of labs  HPI Pt has a number of questions regarding labs and changes that he may need to make regarding these results, as he is pursuing better overall health.  He demonstrates a fair amount of health literacy and willingness to make lifestyle changes to improve his healthy.   Patient Active Problem List   Diagnosis Date Noted  . Essential hypertension 10/31/2018  . Elevated glucose 10/31/2018  . Gastroesophageal reflux disease 10/31/2018    Past Medical History:  Diagnosis Date  . Depression   . Hypertension     Current Outpatient Medications  Medication Sig Dispense Refill  . ALPRAZolam (NIRAVAM) 0.5 MG dissolvable tablet Take 1 tablet (0.5 mg total) by mouth at bedtime as needed for anxiety. 30 tablet 0  . amLODipine (NORVASC) 10 MG tablet Take 1 tablet (10 mg total) by  mouth daily. 90 tablet 3  . atorvastatin (LIPITOR) 40 MG tablet Take 1 tablet (40 mg total) by mouth daily. 90 tablet 1  . buPROPion (WELLBUTRIN SR) 100 MG 12 hr tablet Take 1 tablet (100 mg total) by mouth 2 (two) times daily. 90 tablet 0  . busPIRone (BUSPAR) 5 MG tablet Take 1 tablet (5 mg total) by mouth 3 (three) times daily. 90 tablet 0  . doxycycline (VIBRA-TABS) 100 MG tablet Take 1 tablet (100 mg total) by mouth 2 (two) times daily. 20 tablet 0  . ibuprofen (ADVIL) 600 MG tablet Take 1 tablet (600 mg total) by mouth every 6 (six) hours as needed. 30 tablet 0  . methocarbamol (ROBAXIN) 500 MG tablet Take 1 tablet (500 mg total) by mouth 2 (two) times daily. 10 tablet 0  . omeprazole (PRILOSEC) 20 MG capsule Take 1 hour prior to largest meal daily 90 capsule 3  . ondansetron (ZOFRAN ODT) 4 MG disintegrating tablet Take 1 tablet (4 mg total) by mouth every 8 (eight) hours as needed for nausea or vomiting. 20 tablet 0   No current facility-administered medications for this visit.    No Known Allergies  Social History   Socioeconomic History  . Marital status: Single    Spouse name: Not on file  . Number of children: Not on file  . Years of education: Not on file  . Highest education level: Not on file  Occupational History  . Not on file  Tobacco Use  . Smoking status: Current Some Day Smoker  .  Smokeless tobacco: Never Used  . Tobacco comment: Vape  Substance and Sexual Activity  . Alcohol use: Yes  . Drug use: No  . Sexual activity: Not on file  Other Topics Concern  . Not on file  Social History Narrative  . Not on file   Social Determinants of Health   Financial Resource Strain:   . Difficulty of Paying Living Expenses:   Food Insecurity:   . Worried About Charity fundraiser in the Last Year:   . Arboriculturist in the Last Year:   Transportation Needs:   . Film/video editor (Medical):   Marland Kitchen Lack of Transportation (Non-Medical):   Physical Activity:   .  Days of Exercise per Week:   . Minutes of Exercise per Session:   Stress:   . Feeling of Stress :   Social Connections:   . Frequency of Communication with Friends and Family:   . Frequency of Social Gatherings with Friends and Family:   . Attends Religious Services:   . Active Member of Clubs or Organizations:   . Attends Archivist Meetings:   Marland Kitchen Marital Status:   Intimate Partner Violence:   . Fear of Current or Ex-Partner:   . Emotionally Abused:   Marland Kitchen Physically Abused:   . Sexually Abused:     Review of Systems  Constitutional: Negative.   HENT: Negative.   Eyes: Negative.   Respiratory: Negative.   Cardiovascular: Negative.   Gastrointestinal: Negative.   Genitourinary: Negative.   Musculoskeletal: Negative.   Skin: Negative.   Neurological: Negative.   Endo/Heme/Allergies: Negative.   Psychiatric/Behavioral: Negative.   All other systems reviewed and are negative.   Objective   Vitals as reported by the patient: There were no vitals filed for this visit.  Nicholas Vargas was seen today for follow-up.  Diagnoses and all orders for this visit:  Health education   PLAN  Reviewed labs  Reviewed medication compliance  Reviewed healthy diet and exercise habits  Pt encouraged to ask questions as they arise  Patient encouraged to call clinic with any questions, comments, or concerns.   I discussed the assessment and treatment plan with the patient. The patient was provided an opportunity to ask questions and all were answered. The patient agreed with the plan and demonstrated an understanding of the instructions.   The patient was advised to call back or seek an in-person evaluation if the symptoms worsen or if the condition fails to improve as anticipated.  I provided 16 minutes of non-face-to-face time during this encounter.  Maximiano Coss, NP  Primary [facility] at [facility]

## 2019-08-08 ENCOUNTER — Ambulatory Visit: Payer: Self-pay

## 2019-08-08 ENCOUNTER — Other Ambulatory Visit: Payer: Self-pay | Admitting: Registered Nurse

## 2019-08-08 DIAGNOSIS — I10 Essential (primary) hypertension: Secondary | ICD-10-CM

## 2019-08-08 MED ORDER — LISINOPRIL-HYDROCHLOROTHIAZIDE 20-12.5 MG PO TABS: 1.0000 | ORAL_TABLET | Freq: Every day | ORAL | 3 refills | Status: DC

## 2019-08-08 NOTE — Telephone Encounter (Signed)
Attempted to call pt no answer and no VM will try again later

## 2019-08-08 NOTE — Telephone Encounter (Signed)
Can send over Lisinopril-HCTZ 20-12.5, he can stop taking the amlodipine. Should he have any concerning symptoms like chest pain, shob, doe, headache, visual changes, claudication, he should proceed to urgent care or ED.  He can start this new medication as soon as tomorrow. If he experiences ongoing sxs he should make appt in office.  Thank you!  Jari Sportsman, NP

## 2019-08-08 NOTE — Telephone Encounter (Signed)
Just increased pts amlodipine pt is now struggling with swelling on feet and ankles x2 weeks, pt wants to know what he should do he states it is mil.

## 2019-08-08 NOTE — Telephone Encounter (Signed)
Pt. Reports he has noticed swelling to his feet and ankles x 2 weeks. Can wear his shoes, "but it is noticeable." States his PCP recently changed his blood pressure medication dose. Wants to know if it could be causing this.No other symptoms. Please advise pt. Contact number [phone].  Answer Assessment - Initial Assessment Questions 1. LOCATION: "Which joint is swollen?"     Both feet and ankles 2. ONSET: "When did the swelling start?"     Started 2 weeks ago 3. SIZE: "How large is the swelling?"     Mild 4. PAIN: "Is there any pain?" If so, ask: "How bad is it?" (Scale 1-10; or mild, moderate, severe)     Mild 5. CAUSE: "What do you think caused the swollen joint?"     Maybe medicine 6. OTHER SYMPTOMS: "Do you have any other symptoms?" (e.g., fever, chest pain, difficulty breathing, calf pain)     No 7. PREGNANCY: "Is there any chance you are pregnant?" "When was your last menstrual period?"     n/a  Protocols used: ANKLE SWELLING-A-AH

## 2019-08-12 NOTE — Telephone Encounter (Signed)
Called pt he started the Lisinopril HCTZ and state he has not noticed any more swelling and feels good over this weekend

## 2019-08-28 ENCOUNTER — Other Ambulatory Visit: Payer: Self-pay

## 2019-08-28 ENCOUNTER — Other Ambulatory Visit: Payer: Self-pay | Admitting: Registered Nurse

## 2019-08-28 DIAGNOSIS — F410 Panic disorder [episodic paroxysmal anxiety] without agoraphobia: Secondary | ICD-10-CM

## 2019-08-28 DIAGNOSIS — F418 Other specified anxiety disorders: Secondary | ICD-10-CM

## 2019-08-28 DIAGNOSIS — E782 Mixed hyperlipidemia: Secondary | ICD-10-CM

## 2019-08-28 DIAGNOSIS — K219 Gastro-esophageal reflux disease without esophagitis: Secondary | ICD-10-CM

## 2019-08-28 DIAGNOSIS — K047 Periapical abscess without sinus: Secondary | ICD-10-CM

## 2019-08-28 DIAGNOSIS — I10 Essential (primary) hypertension: Secondary | ICD-10-CM

## 2019-08-28 MED ORDER — ALPRAZOLAM 0.5 MG PO TBDP: 0.5000 mg | ORAL_TABLET | Freq: Every evening | ORAL | 0 refills | Status: DC | PRN

## 2019-08-28 MED ORDER — OMEPRAZOLE 20 MG PO CPDR: DELAYED_RELEASE_CAPSULE | ORAL | 3 refills | Status: DC

## 2019-08-28 MED ORDER — BUSPIRONE HCL 5 MG PO TABS: 5.0000 mg | ORAL_TABLET | Freq: Three times a day (TID) | ORAL | 0 refills | Status: DC

## 2019-08-28 MED ORDER — AMLODIPINE BESYLATE 10 MG PO TABS: 10.0000 mg | ORAL_TABLET | Freq: Every day | ORAL | 3 refills | Status: DC

## 2019-08-28 MED ORDER — LISINOPRIL-HYDROCHLOROTHIAZIDE 20-12.5 MG PO TABS: 1.0000 | ORAL_TABLET | Freq: Every day | ORAL | 3 refills | Status: DC

## 2019-08-28 MED ORDER — BUPROPION HCL ER (SR) 100 MG PO TB12: 100.0000 mg | ORAL_TABLET | Freq: Two times a day (BID) | ORAL | 0 refills | Status: DC

## 2019-08-28 MED ORDER — DOXYCYCLINE HYCLATE 100 MG PO TABS: 100.0000 mg | ORAL_TABLET | Freq: Two times a day (BID) | ORAL | 0 refills | Status: DC

## 2019-08-28 MED ORDER — ATORVASTATIN CALCIUM 40 MG PO TABS: 40.0000 mg | ORAL_TABLET | Freq: Every day | ORAL | 1 refills | Status: DC

## 2019-08-28 NOTE — Telephone Encounter (Signed)
Medication Refill - Medication:   omeprazole (PRILOSEC) 20 MG capsule [[id number]]    Preferred Pharmacy (with phone number or street name):   Agent: Please be advised that RX refills may take up to 3 business days. We ask that you follow-up with your pharmacy.

## 2019-08-29 MED ORDER — ALPRAZOLAM 0.5 MG PO TBDP: 0.5000 mg | ORAL_TABLET | Freq: Every evening | ORAL | 0 refills | Status: DC | PRN

## 2019-09-04 ENCOUNTER — Other Ambulatory Visit: Payer: Self-pay | Admitting: Registered Nurse

## 2019-09-04 DIAGNOSIS — G8929 Other chronic pain: Secondary | ICD-10-CM

## 2019-09-04 DIAGNOSIS — M545 Low back pain, unspecified: Secondary | ICD-10-CM

## 2019-09-04 DIAGNOSIS — K047 Periapical abscess without sinus: Secondary | ICD-10-CM

## 2019-09-04 DIAGNOSIS — E782 Mixed hyperlipidemia: Secondary | ICD-10-CM

## 2019-09-04 DIAGNOSIS — F410 Panic disorder [episodic paroxysmal anxiety] without agoraphobia: Secondary | ICD-10-CM

## 2019-09-04 DIAGNOSIS — I10 Essential (primary) hypertension: Secondary | ICD-10-CM

## 2019-09-04 DIAGNOSIS — K219 Gastro-esophageal reflux disease without esophagitis: Secondary | ICD-10-CM

## 2019-09-04 DIAGNOSIS — F418 Other specified anxiety disorders: Secondary | ICD-10-CM

## 2019-09-04 MED ORDER — BUPROPION HCL ER (SR) 100 MG PO TB12: 100.0000 mg | ORAL_TABLET | Freq: Two times a day (BID) | ORAL | 0 refills | Status: DC

## 2019-09-04 MED ORDER — LISINOPRIL-HYDROCHLOROTHIAZIDE 20-12.5 MG PO TABS: 1.0000 | ORAL_TABLET | Freq: Every day | ORAL | 3 refills | Status: DC

## 2019-09-04 MED ORDER — BUSPIRONE HCL 5 MG PO TABS: 5.0000 mg | ORAL_TABLET | Freq: Three times a day (TID) | ORAL | 0 refills | Status: DC

## 2019-09-04 MED ORDER — METHOCARBAMOL 500 MG PO TABS: 500.0000 mg | ORAL_TABLET | Freq: Two times a day (BID) | ORAL | 0 refills | Status: DC

## 2019-09-04 MED ORDER — ONDANSETRON 4 MG PO TBDP: 4.0000 mg | ORAL_TABLET | Freq: Three times a day (TID) | ORAL | 0 refills | Status: DC | PRN

## 2019-09-04 MED ORDER — ATORVASTATIN CALCIUM 40 MG PO TABS: 40.0000 mg | ORAL_TABLET | Freq: Every day | ORAL | 1 refills | Status: DC

## 2019-09-04 MED ORDER — OMEPRAZOLE 20 MG PO CPDR: DELAYED_RELEASE_CAPSULE | ORAL | 3 refills | Status: DC

## 2019-09-04 MED ORDER — IBUPROFEN 600 MG PO TABS: 600.0000 mg | ORAL_TABLET | Freq: Four times a day (QID) | ORAL | 0 refills | Status: DC | PRN

## 2019-09-04 MED ORDER — DOXYCYCLINE HYCLATE 100 MG PO TABS: 100.0000 mg | ORAL_TABLET | Freq: Two times a day (BID) | ORAL | 0 refills | Status: DC

## 2019-09-04 MED ORDER — ALPRAZOLAM 0.5 MG PO TBDP: 0.5000 mg | ORAL_TABLET | Freq: Every evening | ORAL | 0 refills | Status: DC | PRN

## 2019-11-17 ENCOUNTER — Other Ambulatory Visit: Payer: Self-pay | Admitting: Registered Nurse

## 2019-11-17 DIAGNOSIS — F410 Panic disorder [episodic paroxysmal anxiety] without agoraphobia: Secondary | ICD-10-CM

## 2019-11-17 DIAGNOSIS — E782 Mixed hyperlipidemia: Secondary | ICD-10-CM

## 2019-11-17 DIAGNOSIS — F418 Other specified anxiety disorders: Secondary | ICD-10-CM

## 2019-11-17 DIAGNOSIS — I10 Essential (primary) hypertension: Secondary | ICD-10-CM

## 2019-11-17 MED ORDER — LISINOPRIL-HYDROCHLOROTHIAZIDE 20-12.5 MG PO TABS: 1.0000 | ORAL_TABLET | Freq: Every day | ORAL | 2 refills | Status: DC

## 2019-11-17 MED ORDER — ATORVASTATIN CALCIUM 40 MG PO TABS: 40.0000 mg | ORAL_TABLET | Freq: Every day | ORAL | 0 refills | Status: DC

## 2019-11-17 MED ORDER — BUPROPION HCL ER (SR) 100 MG PO TB12: 100.0000 mg | ORAL_TABLET | Freq: Two times a day (BID) | ORAL | 0 refills | Status: DC

## 2019-11-17 NOTE — Telephone Encounter (Signed)
Requested medication (s) are due for refill today: yes  Requested medication (s) are on the active medication list: yes  Last refill:  09/04/19 #30  Future visit scheduled: no  Notes to clinic:   medication not delegated to NT to refill or refuse   Requested Prescriptions  Pending Prescriptions Disp Refills   ALPRAZolam (NIRAVAM) 0.5 MG dissolvable tablet 30 tablet     Sig: Take 1 tablet (0.5 mg total) by mouth at bedtime as needed for anxiety.      Not Delegated - Psychiatry:  Anxiolytics/Hypnotics Failed - 11/17/2019 12:30 PM      Failed - This refill cannot be delegated      Failed - Urine Drug Screen completed in last 360 days.      Passed - Valid encounter within last 6 months    Recent Outpatient Visits           3 months ago Health education   [facility], Gerlene Burdock, NP   4 months ago Screening for endocrine, metabolic and immunity disorder   [facility], Richard, NP   5 months ago Gastroesophageal reflux disease, unspecified whether esophagitis present   [facility], Gerlene Burdock, NP   1 year ago Essential hypertension   Primary Care at [facility], Minerva Fester, MD   3 years ago Essential hypertension   Primary Care at [place], Godfrey Pick, [place]               Signed Prescriptions Disp Refills   lisinopril-hydrochlorothiazide (ZESTORETIC) 20-12.5 MG tablet 90 tablet 2    Sig: Take 1 tablet by mouth daily.      Cardiovascular:  ACEI + Diuretic Combos Failed - 11/17/2019 12:30 PM      Failed - Last BP in normal range    BP Readings from Last 1 Encounters:  07/01/19 (!) 148/101          Passed - Na in normal range and within 180 days    Sodium  Date Value Ref Range Status  07/01/2019 137 134 - 144 mmol/L Final          Passed - K in normal range and within 180 days    Potassium  Date Value Ref Range Status  07/01/2019 4.7 3.5 - 5.2 mmol/L Final          Passed - Cr in normal range and within 180 days     Creat  Date Value Ref Range Status  12/21/2015 1.13 0.60 - 1.35 mg/dL Final   Creatinine, Ser  Date Value Ref Range Status  07/01/2019 1.12 0.76 - 1.27 mg/dL Final          Passed - Ca in normal range and within 180 days    Calcium  Date Value Ref Range Status  07/01/2019 9.8 8.7 - 10.2 mg/dL Final   Calcium, Ion  Date Value Ref Range Status  07/19/2017 1.23 1.15 - 1.40 mmol/L Final          Passed - Patient is not pregnant      Passed - Valid encounter within last 6 months    Recent Outpatient Visits           3 months ago Health education   [facility], Richard, NP   4 months ago Screening for endocrine, metabolic and immunity disorder   [facility], Richard, NP   5 months ago Gastroesophageal reflux disease, unspecified whether esophagitis  present   [facility], Gerlene Burdock, NP   1 year ago Essential hypertension   Primary Care at [facility], Minerva Fester, MD   3 years ago Essential hypertension   Primary Care at [facility], Godfrey Pick, FNP                atorvastatin (LIPITOR) 40 MG tablet 90 tablet 0    Sig: Take 1 tablet (40 mg total) by mouth daily.      Cardiovascular:  Antilipid - Statins Failed - 11/17/2019 12:30 PM      Failed - Total Cholesterol in normal range and within 360 days    Cholesterol, Total  Date Value Ref Range Status  07/01/2019 270 (H) 100 - 199 mg/dL Final          Failed - LDL in normal range and within 360 days    LDL Chol Calc (NIH)  Date Value Ref Range Status  07/01/2019 180 (H) 0 - 99 mg/dL Final          Failed - Triglycerides in normal range and within 360 days    Triglycerides  Date Value Ref Range Status  07/01/2019 216 (H) 0 - 149 mg/dL Final          Passed - HDL in normal range and within 360 days    HDL  Date Value Ref Range Status  07/01/2019 49 >39 mg/dL Final          Passed - Patient is not pregnant      Passed - Valid encounter within last  12 months    Recent Outpatient Visits           3 months ago Health education   [facility], Gerlene Burdock, NP   4 months ago Screening for endocrine, metabolic and immunity disorder   [facility], Richard, NP   5 months ago Gastroesophageal reflux disease, unspecified whether esophagitis present   [facility], Richard, NP   1 year ago Essential hypertension   Primary Care at [facility], Minerva Fester, MD   3 years ago Essential hypertension   [facility] at [place], Godfrey Pick, FNP                buPROPion [facility] SR) 100 MG 12 hr tablet 90 tablet 0    Sig: Take 1 tablet (100 mg total) by mouth 2 (two) times daily.      Psychiatry: Antidepressants - bupropion Failed - 11/17/2019 12:30 PM      Failed - Last BP in normal range    BP Readings from Last 1 Encounters:  07/01/19 (!) 148/101          Passed - Valid encounter within last 6 months    Recent Outpatient Visits           3 months ago Health education   [facility], Gerlene Burdock, NP   4 months ago Screening for endocrine, metabolic and immunity disorder   [facility], Richard, NP   5 months ago Gastroesophageal reflux disease, unspecified whether esophagitis present   [facility], Gerlene Burdock, NP   1 year ago Essential hypertension   [facility] at [facility], Minerva Fester, MD   3 years ago Essential hypertension   Primary Care at [facility], Godfrey Pick, FNP               Refused Prescriptions  Disp Refills   amLODipine (NORVASC) 10 MG tablet 90 tablet     Sig: Take 1 tablet (10 mg total) by mouth daily.      Cardiovascular:  Calcium Channel Blockers Failed - 11/17/2019 12:30 PM      Failed - Last BP in normal range    BP Readings from Last 1 Encounters:  07/01/19 (!) 148/101          Passed - Valid encounter within last 6 months    Recent Outpatient Visits           3 months ago Health education    [facility], Gerlene Burdock, NP   4 months ago Screening for endocrine, metabolic and immunity disorder   [facility], Richard, NP   5 months ago Gastroesophageal reflux disease, unspecified whether esophagitis present   [facility], Gerlene Burdock, NP   1 year ago Essential hypertension   Primary Care at [facility], Minerva Fester, MD   3 years ago Essential hypertension   Primary Care at [facility], Godfrey Pick, FNP

## 2019-11-17 NOTE — Telephone Encounter (Signed)
All were sent to [facility] initially except for amlodipine (pt has RF there- will refuse). Sending alprazolam request to office Requested Prescriptions  Pending Prescriptions Disp Refills   lisinopril-hydrochlorothiazide (ZESTORETIC) 20-12.5 MG tablet 90 tablet 3    Sig: Take 1 tablet by mouth daily.     Cardiovascular:  ACEI + Diuretic Combos Failed - 11/17/2019 12:30 PM      Failed - Last BP in normal range    BP Readings from Last 1 Encounters:  07/01/19 (!) 148/101         Passed - Na in normal range and within 180 days    Sodium  Date Value Ref Range Status  07/01/2019 137 134 - 144 mmol/L Final         Passed - K in normal range and within 180 days    Potassium  Date Value Ref Range Status  07/01/2019 4.7 3.5 - 5.2 mmol/L Final         Passed - Cr in normal range and within 180 days    Creat  Date Value Ref Range Status  12/21/2015 1.13 0.60 - 1.35 mg/dL Final   Creatinine, Ser  Date Value Ref Range Status  07/01/2019 1.12 0.76 - 1.27 mg/dL Final         Passed - Ca in normal range and within 180 days    Calcium  Date Value Ref Range Status  07/01/2019 9.8 8.7 - 10.2 mg/dL Final   Calcium, Ion  Date Value Ref Range Status  07/19/2017 1.23 1.15 - 1.40 mmol/L Final         Passed - Patient is not pregnant      Passed - Valid encounter within last 6 months    Recent Outpatient Visits          3 months ago Health education   [facility], Gerlene Burdock, NP   4 months ago Screening for endocrine, metabolic and immunity disorder   [facility], Richard, NP   5 months ago Gastroesophageal reflux disease, unspecified whether esophagitis present   [facility], Gerlene Burdock, NP   1 year ago Essential hypertension   Primary Care at [facility], Minerva Fester, MD   3 years ago Essential hypertension   Primary Care at [place], Godfrey Pick, FNP              amLODipine (NORVASC) 10 MG tablet 90 tablet 3    Sig: Take 1  tablet (10 mg total) by mouth daily.     Cardiovascular:  Calcium Channel Blockers Failed - 11/17/2019 12:30 PM      Failed - Last BP in normal range    BP Readings from Last 1 Encounters:  07/01/19 (!) 148/101         Passed - Valid encounter within last 6 months    Recent Outpatient Visits          3 months ago Health education   [facility], Gerlene Burdock, NP   4 months ago Screening for endocrine, metabolic and immunity disorder   [facility], Richard, NP   5 months ago Gastroesophageal reflux disease, unspecified whether esophagitis present   [facility], Gerlene Burdock, NP   1 year ago Essential hypertension   Primary Care at [facility], Minerva Fester, MD   3 years ago Essential hypertension   Primary Care at [facility], Godfrey Pick, FNP  atorvastatin (LIPITOR) 40 MG tablet 90 tablet 1    Sig: Take 1 tablet (40 mg total) by mouth daily.     Cardiovascular:  Antilipid - Statins Failed - 11/17/2019 12:30 PM      Failed - Total Cholesterol in normal range and within 360 days    Cholesterol, Total  Date Value Ref Range Status  07/01/2019 270 (H) 100 - 199 mg/dL Final         Failed - LDL in normal range and within 360 days    LDL Chol Calc (NIH)  Date Value Ref Range Status  07/01/2019 180 (H) 0 - 99 mg/dL Final         Failed - Triglycerides in normal range and within 360 days    Triglycerides  Date Value Ref Range Status  07/01/2019 216 (H) 0 - 149 mg/dL Final         Passed - HDL in normal range and within 360 days    HDL  Date Value Ref Range Status  07/01/2019 49 >39 mg/dL Final         Passed - Patient is not pregnant      Passed - Valid encounter within last 12 months    Recent Outpatient Visits          3 months ago Health education   [facility], Gerlene Burdock, NP   4 months ago Screening for endocrine, metabolic and immunity disorder   [facility], Richard, NP    5 months ago Gastroesophageal reflux disease, unspecified whether esophagitis present   [facility], Gerlene Burdock, NP   1 year ago Essential hypertension   Primary Care at [facility], Minerva Fester, MD   3 years ago Essential hypertension   Primary Care at [place], Godfrey Pick, FNP              ALPRAZolam (NIRAVAM) 0.5 MG dissolvable tablet 30 tablet 0    Sig: Take 1 tablet (0.5 mg total) by mouth at bedtime as needed for anxiety.     Not Delegated - Psychiatry:  Anxiolytics/Hypnotics Failed - 11/17/2019 12:30 PM      Failed - This refill cannot be delegated      Failed - Urine Drug Screen completed in last 360 days.      Passed - Valid encounter within last 6 months    Recent Outpatient Visits          3 months ago Health education   [facility], Gerlene Burdock, NP   4 months ago Screening for endocrine, metabolic and immunity disorder   [facility], Richard, NP   5 months ago Gastroesophageal reflux disease, unspecified whether esophagitis present   [facility], Richard, NP   1 year ago Essential hypertension   Primary Care at [facility], Minerva Fester, MD   3 years ago Essential hypertension   Primary Care at [facility], Godfrey Pick, FNP              buPROPion [facility] SR) 100 MG 12 hr tablet 90 tablet 0    Sig: Take 1 tablet (100 mg total) by mouth 2 (two) times daily.     Psychiatry: Antidepressants - bupropion Failed - 11/17/2019 12:30 PM      Failed - Last BP in normal range    BP Readings from Last 1 Encounters:  07/01/19 (!) 148/101  Passed - Valid encounter within last 6 months    Recent Outpatient Visits          3 months ago Health education   [facility], Gerlene Burdock, NP   4 months ago Screening for endocrine, metabolic and immunity disorder   [facility], Richard, NP   5 months ago Gastroesophageal reflux disease, unspecified whether esophagitis  present   [facility], Gerlene Burdock, NP   1 year ago Essential hypertension   Primary Care at [facility], Minerva Fester, MD   3 years ago Essential hypertension   Primary Care at [facility], Godfrey Pick, FNP

## 2019-11-17 NOTE — Telephone Encounter (Signed)
Medication Refill - Medication: lisnopril, amlodipine, atorvastatin, alprazolam, bupropion   Has the patient contacted their pharmacy? Yes.   (Agent: If no, request that the patient contact the pharmacy for the refill.) (Agent: If yes, when and what did the pharmacy advise?)  Preferred Pharmacy (with phone number or street name):  [id number] [place], [place] - [address] AT [facility] [address] [facility] [address]  [address] [place] [address]  Phone: [phone] Fax: [phone]  Hours: Not open 24 hours     Agent: Please be advised that RX refills may take up to 3 business days. We ask that you follow-up with your pharmacy.

## 2019-11-18 MED ORDER — ALPRAZOLAM 0.5 MG PO TBDP: 0.5000 mg | ORAL_TABLET | Freq: Every evening | ORAL | 0 refills | Status: DC | PRN

## 2019-11-18 NOTE — Telephone Encounter (Signed)
Patient is requesting a refill of the following medications: Requested Prescriptions   Pending Prescriptions Disp Refills   ALPRAZolam (NIRAVAM) 0.5 MG dissolvable tablet 30 tablet     Sig: Take 1 tablet (0.5 mg total) by mouth at bedtime as needed for anxiety.   Signed Prescriptions Disp Refills   lisinopril-hydrochlorothiazide (ZESTORETIC) 20-12.5 MG tablet 90 tablet 2    Sig: Take 1 tablet by mouth daily.    Authorizing Provider: Janeece Agee    Ordering User: Lonia Farber E   atorvastatin (LIPITOR) 40 MG tablet 90 tablet 0    Sig: Take 1 tablet (40 mg total) by mouth daily.    Authorizing Provider: Janeece Agee    Ordering User: Garrison [place]   buPROPion [facility] SR) 100 MG 12 hr tablet 90 tablet 0    Sig: Take 1 tablet (100 mg total) by mouth 2 (two) times daily.    Authorizing Provider: Janeece Agee    Ordering User: Garrison [place]   Refused Prescriptions Disp Refills   amLODipine (NORVASC) 10 MG tablet 90 tablet     Sig: Take 1 tablet (10 mg total) by mouth daily.    Refused By: Lonia Farber E    Reason for Refusal: Patient has requested refill too soon    Date of patient request: 11/18/2019 Last office visit: 07/01/2019 Date of last refill: 09/04/2019 Last refill amount: 30

## 2019-12-30 ENCOUNTER — Other Ambulatory Visit: Payer: Self-pay | Admitting: Registered Nurse

## 2019-12-30 DIAGNOSIS — F418 Other specified anxiety disorders: Secondary | ICD-10-CM

## 2019-12-30 NOTE — Telephone Encounter (Signed)
Patient is requesting a refill of the following medications: Requested Prescriptions   Pending Prescriptions Disp Refills  . buPROPion (WELLBUTRIN SR) 100 MG 12 hr tablet [Pharmacy Med Name: BUPROPION SR 100MG  TABLETS (12 H)] 90 tablet 0    Sig: TAKE 1 TABLET(100 MG) BY MOUTH TWICE DAILY    Date of patient request: 12/30/2019 Last office visit: 07/22/2019 Date of last refill: 11/17/2019 Last refill amount: 90 tablets  Follow up time period per chart:N/A

## 2020-01-09 ENCOUNTER — Other Ambulatory Visit: Payer: Self-pay | Admitting: Registered Nurse

## 2020-01-09 DIAGNOSIS — F418 Other specified anxiety disorders: Secondary | ICD-10-CM

## 2020-01-09 DIAGNOSIS — K219 Gastro-esophageal reflux disease without esophagitis: Secondary | ICD-10-CM

## 2020-02-12 ENCOUNTER — Other Ambulatory Visit: Payer: Self-pay | Admitting: Registered Nurse

## 2020-02-12 DIAGNOSIS — E782 Mixed hyperlipidemia: Secondary | ICD-10-CM

## 2020-05-11 ENCOUNTER — Ambulatory Visit: Payer: 59 | Admitting: Registered Nurse

## 2020-05-12 ENCOUNTER — Encounter: Payer: Self-pay | Admitting: Registered Nurse

## 2020-06-03 ENCOUNTER — Other Ambulatory Visit: Payer: Self-pay | Admitting: Registered Nurse

## 2020-06-03 DIAGNOSIS — K219 Gastro-esophageal reflux disease without esophagitis: Secondary | ICD-10-CM

## 2020-07-05 ENCOUNTER — Ambulatory Visit: Payer: Self-pay | Admitting: Registered Nurse

## 2020-07-20 ENCOUNTER — Encounter: Payer: Self-pay | Admitting: Registered Nurse

## 2020-07-20 ENCOUNTER — Other Ambulatory Visit: Payer: Self-pay

## 2020-07-20 ENCOUNTER — Encounter: Payer: Federal, State, Local not specified - PPO | Admitting: Registered Nurse

## 2020-07-28 ENCOUNTER — Telehealth: Payer: Self-pay | Admitting: Registered Nurse

## 2020-07-28 NOTE — Telephone Encounter (Signed)
Please advise if u would like to send the referral or adjust the medication

## 2020-07-28 NOTE — Telephone Encounter (Signed)
Patient calls stating that Gerlene Burdock was going to put a referral in for him to a psychiatrist .  Please advise

## 2020-08-05 NOTE — Telephone Encounter (Signed)
Opened in error

## 2020-08-28 ENCOUNTER — Other Ambulatory Visit: Payer: Self-pay | Admitting: Registered Nurse

## 2020-08-28 DIAGNOSIS — E782 Mixed hyperlipidemia: Secondary | ICD-10-CM

## 2020-09-14 ENCOUNTER — Ambulatory Visit: Payer: Federal, State, Local not specified - PPO | Admitting: Registered Nurse

## 2020-09-28 ENCOUNTER — Ambulatory Visit: Payer: Federal, State, Local not specified - PPO | Admitting: Registered Nurse

## 2020-09-28 ENCOUNTER — Other Ambulatory Visit: Payer: Self-pay

## 2020-09-28 ENCOUNTER — Encounter: Payer: Self-pay | Admitting: Registered Nurse

## 2020-09-28 VITALS — BP 148/96 | HR 81 | Temp 98.0°F | Resp 18 | Ht 67.0 in | Wt 241.4 lb

## 2020-09-28 DIAGNOSIS — M545 Low back pain, unspecified: Secondary | ICD-10-CM

## 2020-09-28 DIAGNOSIS — Z1211 Encounter for screening for malignant neoplasm of colon: Secondary | ICD-10-CM | POA: Diagnosis not present

## 2020-09-28 DIAGNOSIS — G8929 Other chronic pain: Secondary | ICD-10-CM

## 2020-09-28 DIAGNOSIS — L608 Other nail disorders: Secondary | ICD-10-CM

## 2020-09-28 DIAGNOSIS — B351 Tinea unguium: Secondary | ICD-10-CM

## 2020-09-28 MED ORDER — TRAMADOL HCL 50 MG PO TABS: 50.0000 mg | ORAL_TABLET | Freq: Three times a day (TID) | ORAL | 0 refills | Status: DC | PRN

## 2020-09-28 MED ORDER — DICLOFENAC SODIUM 75 MG PO TBEC: 75.0000 mg | DELAYED_RELEASE_TABLET | Freq: Two times a day (BID) | ORAL | 0 refills | Status: DC

## 2020-09-28 MED ORDER — TERBINAFINE HCL 1 % EX CREA: 1.0000 "application " | TOPICAL_CREAM | Freq: Two times a day (BID) | CUTANEOUS | 0 refills | Status: DC

## 2020-09-28 MED ORDER — METHOCARBAMOL 500 MG PO TABS: 500.0000 mg | ORAL_TABLET | Freq: Two times a day (BID) | ORAL | 0 refills | Status: DC

## 2020-09-28 NOTE — Patient Instructions (Addendum)
[name] -   Good to see you today. In brief:  Pain: I have sent methocarbamol, diclofenac, and tramadol. Let me know how you do with these. Pain management should call in 2-3 weeks or so.  Mental Health: I have referred to psychiatry. They will call within a month or so. Let me know if things change or deteriorate before then - we can discuss options to bridge until we can get you in with specialist.  Nails: Terbinafine applied topically twice daily. Put some on right before you put gloves on. I think this should help. The dark streaks on the nails look benign but I would like dermatology to check them out. I have placed a referral. This will likely be the longest wait - usually around 6-8 weeks before an appt can be booked.  Colon cancer screening: I have ordered an at home screening kit called cologuard to get this done. It should ship to you with very clear instructions. I'll follow up when results are in  Physical: you are due - let's aim for some time in August-September-October or so.  Thank you  [name]     If you have lab work done today you will be contacted with your lab results within the next 2 weeks.  If you have not heard from [place] then please contact us. The fastest way to get your results is to register for My Chart.   IF you received an x-ray today, you will receive an invoice from [facility]. Please contact [facility] at [phone] with questions or concerns regarding your invoice.   IF you received labwork today, you will receive an invoice from [facility]. Please contact [facility] at [phone] with questions or concerns regarding your invoice.   Our billing staff will not be able to assist you with questions regarding bills from these companies.  You will be contacted with the lab results as soon as they are available. The fastest way to get your results is to activate your My Chart account. Instructions are located on the last page of this  paperwork. If you have not heard from [place] regarding the results in 2 weeks, please contact this office.

## 2020-09-28 NOTE — Progress Notes (Signed)
Established Patient Office Visit  Subjective:  Patient ID: Nicholas Vargas, male    DOB: 03/04/1971  Age: 50 y.o. MRN: [id number]  CC:  Chief Complaint  Patient presents with  . Referral    Patient states he would like to discuss a referral to pain management. And wants provider to take a look at his nails.    HPI [facility] presents for chronic pain and nail issue  Chronic pain Lower back, bilateral, with sciatica No progression or worsening No saddle symptoms No recent trauma or acute injury Has been on methocarbamol and ibuprofen in the past, limited effect Most recent imaging available is 11/26/2018 showing mild lumbar ddd most at L3-L4, mild lumbar epidural lipomatosis without stenosis or impingement.  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  Past Medical History:  Diagnosis Date  . Depression   . Hypertension     No past surgical history on file.  No family history on file.  Social History   Socioeconomic History  . Marital status: Single    Spouse name: Not on file  . Number of children: Not on file  . Years of education: Not on file  . Highest education level: Not on file  Occupational History  . Not on file  Tobacco Use  . Smoking status: Current Some Day Smoker  . Smokeless tobacco: Never Used  . Tobacco comment: Vape  Substance and Sexual Activity  . Alcohol use: Yes  . Drug use: No  . Sexual activity: Not on file  Other Topics Concern  . Not on file  Social History Narrative  .  Not on file   Social Determinants of Health   Financial Resource Strain: Not on file  Food Insecurity: Not on file  Transportation Needs: Not on file  Physical Activity: Not on file  Stress: Not on file  Social Connections: Not on file  Intimate Partner Violence: Not on file    Outpatient Medications Prior to Visit  Medication Sig Dispense Refill  . ALPRAZolam (NIRAVAM) 0.5 MG dissolvable tablet Take 1 tablet (0.5 mg total) by mouth at bedtime as needed for anxiety. 30 tablet 0  . amLODipine (NORVASC) 10 MG tablet Take 1 tablet (10 mg total) by mouth daily. 90 tablet 3  . atorvastatin (LIPITOR) 40 MG tablet TAKE 1 TABLET(40 MG) BY MOUTH DAILY 90 tablet 0  . buPROPion (WELLBUTRIN SR) 100 MG 12 hr tablet TAKE 1 TABLET(100 MG) BY MOUTH TWICE DAILY 180 tablet 3  . busPIRone (BUSPAR) 5 MG tablet TAKE 1 TABLET(5 MG) BY MOUTH THREE TIMES DAILY 90 tablet 0  . doxycycline (VIBRA-TABS) 100 MG tablet Take 1 tablet (100 mg total) by mouth 2 (two) times daily. 20 tablet 0  . ibuprofen (ADVIL) 600 MG tablet Take 1 tablet (600 mg total) by mouth every 6 (six) hours as needed. 30 tablet 0  . lisinopril-hydrochlorothiazide (ZESTORETIC) 20-12.5 MG tablet Take 1 tablet by mouth daily. 90 tablet 2  . methocarbamol (ROBAXIN) 500 MG tablet Take 1 tablet (500 mg total) by mouth 2 (two) times daily. 60 tablet 0  .  omeprazole (PRILOSEC) 20 MG capsule TAKE 1 HOUR PRIOR TO LARGEST MEAL DAILY 90 capsule 0  . ondansetron (ZOFRAN ODT) 4 MG disintegrating tablet Take 1 tablet (4 mg total) by mouth every 8 (eight) hours as needed for nausea or vomiting. 20 tablet 0   No facility-administered medications prior to visit.    No Known Allergies  ROS Review of Systems  Constitutional: Negative.   HENT: Negative.   Eyes: Negative.   Respiratory: Negative.   Cardiovascular: Negative.   Gastrointestinal: Negative.   Endocrine: Negative.   Genitourinary: Negative.   Musculoskeletal: Positive for back pain.  Skin:  Negative.        Nail abnormality  Allergic/Immunologic: Negative.   Neurological: Negative.   Hematological: Negative.   Psychiatric/Behavioral: Positive for dysphoric mood.  All other systems reviewed and are negative.     Objective:    Physical Exam Constitutional:      General: He is not in acute distress.    Appearance: Normal appearance. He is normal weight. He is not ill-appearing, toxic-appearing or diaphoretic.  Cardiovascular:     Rate and Rhythm: Normal rate and regular rhythm.     Heart sounds: Normal heart sounds. No murmur heard. No friction rub. No gallop.   Pulmonary:     Effort: Pulmonary effort is normal. No respiratory distress.     Breath sounds: Normal breath sounds. No stridor. No wheezing, rhonchi or rales.  Chest:     Chest wall: No tenderness.  Skin:    Findings: Lesion (apparent infection across all nails both hands. appears as oncyhomycosis. some vertical streaks of hyperpigmentation with question of nail bed involvement.) present.  Neurological:     General: No focal deficit present.     Mental Status: He is alert and oriented to person, place, and time. Mental status is at baseline.  Psychiatric:        Mood and Affect: Mood normal.        Behavior: Behavior normal.        Thought Content: Thought content normal.        Judgment: Judgment normal.     BP (!) 148/96   Pulse 81   Temp 98 F (36.7 C) (Temporal)   Resp 18   Ht 5\' 7"  (1.702 m)   Wt 241 lb 6.4 oz (109.5 kg)   SpO2 99%   BMI 37.81 kg/m  Wt Readings from Last 3 Encounters:  09/28/20 241 lb 6.4 oz (109.5 kg)  07/20/20 220 lb (99.8 kg)  07/01/19 238 lb 6.4 oz (108.1 kg)     There are no preventive care reminders to display for this patient.  There are no preventive care reminders to display for this patient.  Lab Results  Component Value Date   TSH 1.690 07/01/2019   Lab Results  Component Value Date   WBC 4.7 07/01/2019   HGB 14.6 07/01/2019   HCT 43.3 07/01/2019    MCV 90 07/01/2019   PLT 201 07/01/2019   Lab Results  Component Value Date   NA 137 07/01/2019   K 4.7 07/01/2019   CO2 23 07/01/2019   GLUCOSE 105 (H) 07/01/2019   BUN 15 07/01/2019   CREATININE 1.12 07/01/2019   BILITOT 0.4 07/01/2019   ALKPHOS 60 07/01/2019   AST 41 (H) 07/01/2019   ALT 64 (H) 07/01/2019   PROT 7.2 07/01/2019   ALBUMIN 4.8 07/01/2019   CALCIUM 9.8 07/01/2019   ANIONGAP 9 11/23/2017   Lab Results  Component Value Date  CHOL 270 (H) 07/01/2019   Lab Results  Component Value Date   HDL 49 07/01/2019   Lab Results  Component Value Date   LDLCALC 180 (H) 07/01/2019   Lab Results  Component Value Date   TRIG 216 (H) 07/01/2019   Lab Results  Component Value Date   CHOLHDL 5.5 (H) 07/01/2019   Lab Results  Component Value Date   HGBA1C 5.8 (H) 07/01/2019      Assessment & Plan:   Problem List Items Addressed This Visit   None   Visit Diagnoses    Chronic bilateral low back pain without sciatica    -  Primary   Relevant Orders   Ambulatory referral to Pain Clinic      No orders of the defined types were placed in this encounter.   Follow-up: No follow-ups on file.   PLAN  Terbinafine topical for nail infection. Monitor for improvement. Refer to derm for melanonychia - lesions look fairly benign but given question of nail bed involvement want to rule out melanoma, though I think this is not very likely.   Refer to pain clinic. Today will give methocarbamol, diclofenac, and tramadol for pain. If pain persists can consider short course bridge of oxycodone before pain management assumes care.  Refer to psychiatry  Due for colon cancer screening. Ordered cologuard. No fam hx of colon ca, no symptoms  Patient encouraged to call clinic with any questions, comments, or concerns.  Janeece Agee, NP

## 2020-10-04 ENCOUNTER — Encounter: Payer: Self-pay | Admitting: Physical Medicine & Rehabilitation

## 2020-10-20 ENCOUNTER — Other Ambulatory Visit: Payer: Self-pay | Admitting: Registered Nurse

## 2020-10-20 DIAGNOSIS — G8929 Other chronic pain: Secondary | ICD-10-CM

## 2020-10-20 DIAGNOSIS — M545 Low back pain, unspecified: Secondary | ICD-10-CM

## 2020-10-20 NOTE — Telephone Encounter (Signed)
Medication not on current med list.  

## 2020-10-21 ENCOUNTER — Other Ambulatory Visit: Payer: Self-pay | Admitting: Registered Nurse

## 2020-10-21 DIAGNOSIS — G8929 Other chronic pain: Secondary | ICD-10-CM

## 2020-10-21 NOTE — Telephone Encounter (Signed)
LFD 09/28/20 #30 with no refills LOV 09/28/20 NOV none

## 2020-10-29 ENCOUNTER — Other Ambulatory Visit: Payer: Self-pay | Admitting: Registered Nurse

## 2020-10-29 DIAGNOSIS — F418 Other specified anxiety disorders: Secondary | ICD-10-CM

## 2020-10-29 DIAGNOSIS — F410 Panic disorder [episodic paroxysmal anxiety] without agoraphobia: Secondary | ICD-10-CM

## 2020-11-12 ENCOUNTER — Other Ambulatory Visit: Payer: Self-pay | Admitting: Registered Nurse

## 2020-11-12 DIAGNOSIS — M545 Low back pain, unspecified: Secondary | ICD-10-CM

## 2020-11-12 DIAGNOSIS — G8929 Other chronic pain: Secondary | ICD-10-CM

## 2020-11-12 NOTE — Telephone Encounter (Signed)
LAST APPOINTMENT DATE: 10/21/2020   NEXT APPOINTMENT DATE: Visit date not found    LAST REFILL:  10/20/2020  QTY:

## 2020-11-23 ENCOUNTER — Other Ambulatory Visit: Payer: Self-pay

## 2020-11-23 ENCOUNTER — Encounter
Payer: Federal, State, Local not specified - PPO | Attending: Physical Medicine & Rehabilitation | Admitting: Physical Medicine & Rehabilitation

## 2020-11-23 ENCOUNTER — Encounter: Payer: Self-pay | Admitting: Physical Medicine & Rehabilitation

## 2020-11-23 VITALS — BP 130/92 | HR 89 | Temp 98.0°F | Ht 67.0 in | Wt 242.8 lb

## 2020-11-23 DIAGNOSIS — M545 Low back pain, unspecified: Secondary | ICD-10-CM

## 2020-11-23 DIAGNOSIS — G8929 Other chronic pain: Secondary | ICD-10-CM
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
## 2020-11-23 NOTE — Patient Instructions (Addendum)
Would continue current pain meds   Will refer to chiropracter Dr Lorel Monaco at [facility] chirop[facility]ic call [phone], try for a month then see me back to further assess   Will see you back for followup

## 2020-12-06 ENCOUNTER — Other Ambulatory Visit: Payer: Self-pay | Admitting: Registered Nurse

## 2020-12-06 DIAGNOSIS — G8929 Other chronic pain: Secondary | ICD-10-CM

## 2020-12-07 ENCOUNTER — Telehealth: Payer: Self-pay | Admitting: *Deleted

## 2020-12-07 DIAGNOSIS — G8929 Other chronic pain: Secondary | ICD-10-CM

## 2020-12-07 DIAGNOSIS — M545 Low back pain, unspecified: Secondary | ICD-10-CM

## 2020-12-07 MED ORDER — TRAMADOL HCL 50 MG PO TABS: 50.0000 mg | ORAL_TABLET | Freq: Every evening | ORAL | 5 refills | Status: DC

## 2020-12-07 MED ORDER — METHOCARBAMOL 500 MG PO TABS: 500.0000 mg | ORAL_TABLET | Freq: Every day | ORAL | 0 refills | Status: AC | PRN

## 2020-12-07 NOTE — Telephone Encounter (Signed)
Mr Sweetin called for a refill on 2 medications. He did not name them but I assume he is speaking of tramadol and methocarbamol.

## 2020-12-07 NOTE — Telephone Encounter (Signed)
Left message on home VM informing him of the meds sent to pharmacy per DPR.

## 2020-12-09 ENCOUNTER — Telehealth: Payer: Self-pay

## 2020-12-09 NOTE — Telephone Encounter (Signed)
Patient is requesting a refill of the following medications: Requested Prescriptions    No prescriptions requested or ordered in this encounter    Date of patient request: 12/07/2020 Last office visit: 09/28/2020 Date of last refill: 11/12/2020 Last refill amount: 30 Follow up time period per chart: n/a

## 2020-12-09 NOTE — Telephone Encounter (Signed)
Unfortunately no med attached  Thanks  Rich

## 2020-12-15 ENCOUNTER — Other Ambulatory Visit: Payer: Self-pay | Admitting: Registered Nurse

## 2020-12-15 DIAGNOSIS — K219 Gastro-esophageal reflux disease without esophagitis: Secondary | ICD-10-CM

## 2020-12-15 DIAGNOSIS — I10 Essential (primary) hypertension: Secondary | ICD-10-CM

## 2020-12-23 NOTE — Progress Notes (Signed)
A user error has taken place: encounter opened in error, closed for administrative reasons.

## 2021-01-14 ENCOUNTER — Other Ambulatory Visit: Payer: Self-pay | Admitting: Registered Nurse

## 2021-01-14 DIAGNOSIS — F418 Other specified anxiety disorders: Secondary | ICD-10-CM

## 2021-01-23 ENCOUNTER — Other Ambulatory Visit: Payer: Self-pay | Admitting: Registered Nurse

## 2021-01-23 DIAGNOSIS — I10 Essential (primary) hypertension: Secondary | ICD-10-CM

## 2021-02-22 ENCOUNTER — Encounter: Payer: Federal, State, Local not specified - PPO | Admitting: Physical Medicine & Rehabilitation

## 2021-02-28 ENCOUNTER — Other Ambulatory Visit: Payer: Self-pay | Admitting: Registered Nurse

## 2021-02-28 DIAGNOSIS — F410 Panic disorder [episodic paroxysmal anxiety] without agoraphobia: Secondary | ICD-10-CM

## 2021-03-04 ENCOUNTER — Other Ambulatory Visit: Payer: Self-pay | Admitting: Registered Nurse

## 2021-03-04 DIAGNOSIS — E782 Mixed hyperlipidemia: Secondary | ICD-10-CM

## 2021-03-11 DIAGNOSIS — M545 Low back pain, unspecified: Secondary | ICD-10-CM | POA: Diagnosis not present

## 2021-04-12 ENCOUNTER — Encounter: Payer: Federal, State, Local not specified - PPO | Admitting: Physical Medicine & Rehabilitation

## 2021-06-07 ENCOUNTER — Encounter
Payer: Federal, State, Local not specified - PPO | Attending: Physical Medicine & Rehabilitation | Admitting: Physical Medicine & Rehabilitation

## 2021-06-07 ENCOUNTER — Other Ambulatory Visit: Payer: Self-pay

## 2021-06-07 ENCOUNTER — Encounter: Payer: Self-pay | Admitting: Physical Medicine & Rehabilitation

## 2021-06-07 VITALS — BP 139/91 | HR 94 | Ht 67.0 in | Wt 243.0 lb

## 2021-06-07 DIAGNOSIS — G8929 Other chronic pain: Secondary | ICD-10-CM | POA: Diagnosis not present

## 2021-06-07 DIAGNOSIS — F32A Depression, unspecified: Secondary | ICD-10-CM | POA: Diagnosis not present

## 2021-06-07 DIAGNOSIS — M545 Low back pain, unspecified: Secondary | ICD-10-CM | POA: Diagnosis not present

## 2021-06-07 DIAGNOSIS — S0990XS Unspecified injury of head, sequela: Secondary | ICD-10-CM | POA: Insufficient documentation

## 2021-06-07 DIAGNOSIS — F4323 Adjustment disorder with mixed anxiety and depressed mood: Secondary | ICD-10-CM | POA: Diagnosis not present

## 2021-06-07 MED ORDER — TRAMADOL HCL 50 MG PO TABS: 50.0000 mg | ORAL_TABLET | Freq: Two times a day (BID) | ORAL | 5 refills | Status: DC

## 2021-06-07 NOTE — Progress Notes (Signed)
Subjective:    Patient ID: Nicholas Vargas, male    DOB: 23-Aug-1970, 51 y.o.   MRN: [id number]  HPI 51 year old male with work-related injury who follows up for physical medicine and rehabilitation evaluation.  He has a work form with restrictions.  He has been released to work by previous physician with permanent restrictions.  The patient is taking tramadol once a day in the evening but has pain during the day.  His pain is primarily in his back but over the last couple months has been developing increasing left lower extremity pain during ambulation.  He has no bowel or bladder dysfunction no progressive weakness. Increasing back pain radiating to the left thigh, pain increased with ambulation   Also complaining of being depressed.  Denies suicidal thoughts or ideation.  He feels bad about himself.  He is sad that he cannot make any social plans because of his pains including travel. Pain Inventory Average Pain 8 Pain Right Now 8 My pain is constant, sharp, dull, tingling, and aching  In the last 24 hours, has pain interfered with the following? General activity 8 Relation with others 8 Enjoyment of life 8 What TIME of day is your pain at its worst? varies Sleep (in general) Poor  Pain is worse with: walking, bending, sitting, inactivity, standing, and some activites Pain improves with: rest, heat/ice, medication, and injections Relief from Meds: 7  No family history on file. Social History   Socioeconomic History   Marital status: Single    Spouse name: Not on file   Number of children: Not on file   Years of education: Not on file   Highest education level: Not on file  Occupational History   Not on file  Tobacco Use   Smoking status: Never   Smokeless tobacco: Never   Tobacco comments:    Vape  Vaping Use   Vaping Use: Former  Substance and Sexual Activity   Alcohol use: Yes   Drug use: No   Sexual activity: Not on file  Other Topics Concern   Not on  file  Social History Narrative   Not on file   Social Determinants of Health   Financial Resource Strain: Not on file  Food Insecurity: Not on file  Transportation Needs: Not on file  Physical Activity: Not on file  Stress: Not on file  Social Connections: Not on file   No past surgical history on file. No past surgical history on file. Past Medical History:  Diagnosis Date   Depression    Hypertension    BP (!) 139/91    Pulse 94    Ht 5\' 7"  (1.702 m)    Wt 243 lb (110.2 kg)    SpO2 98%    BMI 38.06 kg/m   Opioid Risk Score:   Fall Risk Score:  `1  Depression screen PHQ 2/9  Depression screen [facility] 2/9 06/07/2021 11/23/2020 07/20/2020 07/22/2019 07/01/2019 06/13/2019 10/29/2018  Decreased Interest 1 3 3  0 3 0 0  Down, Depressed, Hopeless 1 3 3  0 2 0 0  PHQ - 2 Score 2 6 6  0 5 0 0  Altered sleeping - 3 3 - 3 - -  Tired, decreased energy - 2 3 - 3 - -  Change in appetite - 3 3 - 3 - -  Feeling bad or failure about yourself  - 3 1 - 0 - -  Trouble concentrating - 3 3 - 3 - -  Moving slowly or fidgety/restless -  3 0 - 0 - -  Suicidal thoughts - 0 0 - 0 - -  PHQ-9 Score - 23 19 - 17 - -  Difficult doing work/chores - - Extremely dIfficult - Somewhat difficult - -      Review of Systems  Musculoskeletal:  Positive for back pain.       Pain going down legs  Neurological:  Positive for numbness.  All other systems reviewed and are negative.     Objective:   Physical Exam Constitutional:      Appearance: He is obese.  HENT:     Head: Normocephalic and atraumatic.  Eyes:     Extraocular Movements: Extraocular movements intact.     Conjunctiva/sclera: Conjunctivae normal.     Pupils: Pupils are equal, round, and reactive to light.  Musculoskeletal:     Right lower leg: No edema.     Left lower leg: No edema.     Comments: Pain with lumbar extension radiating into the left lower limb Patient has normal lumbar flexion although with some endrange pain No tenderness palpation  along the lumbar paraspinals  Skin:    General: Skin is warm and dry.  Neurological:     Mental Status: He is alert.     Comments: Motor strength is 5/5 bilateral hip flexor knee extensor ankle dorsiflexor Negative straight leg raise Sensation intact to light touch bilateral lower extremities Deep tendon reflexes 1+ bilateral knee and ankle  Psychiatric:        Mood and Affect: Mood normal.        Behavior: Behavior normal.          Assessment & Plan:   #1.  Lumbar pain with left lower extremity radiating pain in the L3 pattern, this is consistent with prior MRI showing left L3 nerve root impingement.  Has had good relief for this particular discomfort with epidural steroid injection.  We will schedule for repeat 2.  Chronic lumbar pain continue tramadol 50 mg will increase to twice daily to cover daytime pain. 3.  Continue current work restrictions insurance form filled out 4.  Depression/adjustment disorder related to work injury, patient concerned about social limitations.  We will make referral to neuropsychology

## 2021-06-07 NOTE — Patient Instructions (Signed)
Please start your PT exercises once again

## 2021-06-14 ENCOUNTER — Ambulatory Visit: Payer: Federal, State, Local not specified - PPO | Admitting: Registered Nurse

## 2021-07-19 ENCOUNTER — Encounter: Payer: Federal, State, Local not specified - PPO | Admitting: Physical Medicine & Rehabilitation

## 2021-08-02 ENCOUNTER — Ambulatory Visit (INDEPENDENT_AMBULATORY_CARE_PROVIDER_SITE_OTHER): Payer: Federal, State, Local not specified - PPO | Admitting: Registered Nurse

## 2021-08-02 ENCOUNTER — Other Ambulatory Visit: Payer: Self-pay

## 2021-08-02 ENCOUNTER — Encounter: Payer: Self-pay | Admitting: Registered Nurse

## 2021-08-02 VITALS — BP 151/118 | HR 60 | Temp 98.2°F | Resp 18 | Ht 67.0 in | Wt 246.8 lb

## 2021-08-02 DIAGNOSIS — I10 Essential (primary) hypertension: Secondary | ICD-10-CM | POA: Diagnosis not present

## 2021-08-02 DIAGNOSIS — F418 Other specified anxiety disorders: Secondary | ICD-10-CM | POA: Diagnosis not present

## 2021-08-02 DIAGNOSIS — R7303 Prediabetes: Secondary | ICD-10-CM | POA: Diagnosis not present

## 2021-08-02 DIAGNOSIS — F410 Panic disorder [episodic paroxysmal anxiety] without agoraphobia: Secondary | ICD-10-CM

## 2021-08-02 DIAGNOSIS — Z1211 Encounter for screening for malignant neoplasm of colon: Secondary | ICD-10-CM

## 2021-08-02 DIAGNOSIS — E782 Mixed hyperlipidemia: Secondary | ICD-10-CM

## 2021-08-02 DIAGNOSIS — Z1159 Encounter for screening for other viral diseases: Secondary | ICD-10-CM | POA: Diagnosis not present

## 2021-08-02 DIAGNOSIS — Z23 Encounter for immunization: Secondary | ICD-10-CM

## 2021-08-02 DIAGNOSIS — K219 Gastro-esophageal reflux disease without esophagitis: Secondary | ICD-10-CM

## 2021-08-02 LAB — CBC WITH DIFFERENTIAL/PLATELET
Basophils Absolute: 0 10*3/uL (ref 0.0–0.1)
Basophils Relative: 0.6 % (ref 0.0–3.0)
Eosinophils Absolute: 0 10*3/uL (ref 0.0–0.7)
Eosinophils Relative: 0.7 % (ref 0.0–5.0)
HCT: 43.6 % (ref 39.0–52.0)
Hemoglobin: 14.6 g/dL (ref 13.0–17.0)
Lymphocytes Relative: 42.9 % (ref 12.0–46.0)
Lymphs Abs: 2.3 10*3/uL (ref 0.7–4.0)
MCHC: 33.5 g/dL (ref 30.0–36.0)
MCV: 89.8 fl (ref 78.0–100.0)
Monocytes Absolute: 0.5 10*3/uL (ref 0.1–1.0)
Monocytes Relative: 9.4 % (ref 3.0–12.0)
Neutro Abs: 2.4 10*3/uL (ref 1.4–7.7)
Neutrophils Relative %: 46.4 % (ref 43.0–77.0)
Platelets: 212 10*3/uL (ref 150.0–400.0)
RBC: 4.85 Mil/uL (ref 4.22–5.81)
RDW: 13.1 % (ref 11.5–15.5)
WBC: 5.3 10*3/uL (ref 4.0–10.5)

## 2021-08-02 LAB — COMPREHENSIVE METABOLIC PANEL
ALT: 68 U/L — ABNORMAL HIGH (ref 0–53)
AST: 39 U/L — ABNORMAL HIGH (ref 0–37)
Albumin: 4.9 g/dL (ref 3.5–5.2)
Alkaline Phosphatase: 52 U/L (ref 39–117)
BUN: 17 mg/dL (ref 6–23)
CO2: 30 mEq/L (ref 19–32)
Calcium: 10 mg/dL (ref 8.4–10.5)
Chloride: 99 mEq/L (ref 96–112)
Creatinine, Ser: 1.04 mg/dL (ref 0.40–1.50)
GFR: 84.81 mL/min (ref >60.00)
Glucose, Bld: 92 mg/dL (ref 70–99)
Potassium: 4.1 mEq/L (ref 3.5–5.1)
Sodium: 136 mEq/L (ref 135–145)
Total Bilirubin: 1 mg/dL (ref 0.2–1.2)
Total Protein: 7.5 g/dL (ref 6.0–8.3)

## 2021-08-02 LAB — LIPID PANEL
Cholesterol: 194 mg/dL (ref 0–200)
HDL: 48 mg/dL (ref >39.00)
NonHDL: 146.46
Total CHOL/HDL Ratio: 4
Triglycerides: 270 mg/dL — ABNORMAL HIGH (ref 0.0–149.0)
VLDL: 54 mg/dL — ABNORMAL HIGH (ref 0.0–40.0)

## 2021-08-02 LAB — HEMOGLOBIN A1C: Hgb A1c MFr Bld: 6.3 % (ref 4.6–6.5)

## 2021-08-02 LAB — LDL CHOLESTEROL, DIRECT: Direct LDL: 90 mg/dL

## 2021-08-02 MED ORDER — OMEPRAZOLE 20 MG PO CPDR: DELAYED_RELEASE_CAPSULE | ORAL | 3 refills | Status: DC

## 2021-08-02 MED ORDER — LISINOPRIL 40 MG PO TABS: 40.0000 mg | ORAL_TABLET | Freq: Every day | ORAL | 3 refills | Status: DC

## 2021-08-02 MED ORDER — ALPRAZOLAM 0.5 MG PO TBDP: ORAL_TABLET | ORAL | 0 refills | Status: AC

## 2021-08-02 MED ORDER — HYDROCHLOROTHIAZIDE 25 MG PO TABS: 25.0000 mg | ORAL_TABLET | Freq: Every day | ORAL | 3 refills | Status: AC

## 2021-08-02 MED ORDER — ATORVASTATIN CALCIUM 40 MG PO TABS: 40.0000 mg | ORAL_TABLET | Freq: Every day | ORAL | 3 refills | Status: DC

## 2021-08-02 MED ORDER — BUSPIRONE HCL 5 MG PO TABS: 5.0000 mg | ORAL_TABLET | Freq: Three times a day (TID) | ORAL | 3 refills | Status: AC

## 2021-08-02 MED ORDER — BUPROPION HCL ER (SR) 100 MG PO TB12: 100.0000 mg | ORAL_TABLET | Freq: Every day | ORAL | 3 refills | Status: DC

## 2021-08-02 NOTE — Patient Instructions (Addendum)
Nicholas Vargas - ? ?Great to see you ? ?Increase lisinopril to 40mg , hctz to 25mg   ? ?Other meds refilled ? ?I'll call if labs are worrisome ? ?See you in 3 weeks for BP check, 6 mo for physical and labs ? ?Thank you ? ?[name]  ? ? ? ?If you have lab work done today you will be contacted with your lab results within the next 2 weeks.  If you have not heard from [place] then please contact us. The fastest way to get your results is to register for My Chart. ? ? ?IF you received an x-ray today, you will receive an invoice from [facility]. Please contact [facility] at [phone] with questions or concerns regarding your invoice.  ? ?IF you received labwork today, you will receive an invoice from [facility]. Please contact [facility] at [phone] with questions or concerns regarding your invoice.  ? ?Our billing staff will not be able to assist you with questions regarding bills from these companies. ? ?You will be contacted with the lab results as soon as they are available. The fastest way to get your results is to activate your My Chart account. Instructions are located on the last page of this paperwork. If you have not heard from [place] regarding the results in 2 weeks, please contact this office. ?  ? ? ?

## 2021-08-02 NOTE — Progress Notes (Signed)
? ?Established Patient Office Visit ? ?Subjective:  ?Patient ID: Nicholas Vargas, male    DOB: 11/04/1970  Age: 51 y.o. MRN: [id number] ? ?CC:  ?Chief Complaint  ?Patient presents with  ? Hypertension  ?  Patient states he   ? ? ?HPI ?Nicholas Vargas presents for htn ? ?Hypertension: ?Patient Currently taking: lisinopril-hctz 20-12.5mg  po qd. ?Good effect. No AEs. ?Denies CV symptoms including: chest pain, shob, doe, headache, visual changes, fatigue, claudication, and dependent edema.  ? ?Previous readings and labs: ?BP Readings from Last 3 Encounters:  ?08/02/21 (!) 151/118  ?06/07/21 (!) 139/91  ?11/23/20 (!) 130/92  ? ?Lab Results  ?Component Value Date  ? CREATININE 1.12 07/01/2019  ? ? ?Anxiety and Depression ?Doing well with wellbutrin, buspar, and alprazolam ?No Ae. Hopes to continue ? ?Prediabetes ?Would like to recheck labs. ?Has been around  2 years since labs. ? ?HM ?Will check hep C and HIV test today  ?Refer to GI for colonoscopy.  ? ?Outpatient Medications Prior to Visit  ?Medication Sig Dispense Refill  ? diclofenac (VOLTAREN) 75 MG EC tablet TAKE 1 TABLET(75 MG) BY MOUTH TWICE DAILY 30 tablet 0  ? ibuprofen (ADVIL) 600 MG tablet Take 1 tablet (600 mg total) by mouth every 6 (six) hours as needed. 30 tablet 0  ? methocarbamol (ROBAXIN) 500 MG tablet Take 1 tablet (500 mg total) by mouth daily as needed for muscle spasms. 30 tablet 0  ? traMADol (ULTRAM) 50 MG tablet Take 1 tablet (50 mg total) by mouth 2 (two) times daily. 60 tablet 5  ? ALPRAZolam (NIRAVAM) 0.5 MG dissolvable tablet DISSOLVE 1 TABLET(0.5 MG) ON THE TONGUE AT BEDTIME AS NEEDED FOR ANXIETY 30 tablet 0  ? atorvastatin (LIPITOR) 40 MG tablet TAKE 1 TABLET(40 MG) BY MOUTH DAILY 90 tablet 0  ? buPROPion ER (WELLBUTRIN SR) 100 MG 12 hr tablet TAKE 1 TABLET(100 MG) BY MOUTH TWICE DAILY 180 tablet 3  ? busPIRone (BUSPAR) 5 MG tablet TAKE 1 TABLET(5 MG) BY MOUTH THREE TIMES DAILY 90 tablet 0  ? lisinopril-hydrochlorothiazide  (ZESTORETIC) 20-12.5 MG tablet TAKE 1 TABLET BY MOUTH DAILY 90 tablet 0  ? omeprazole (PRILOSEC) 20 MG capsule TAKE ONE CAPSULE BY MOUTH DAILY 1 HOUR PRIOR TO LARGEST MEAL 90 capsule 0  ? ?No facility-administered medications prior to visit.  ? ? ?Review of Systems  ?Constitutional: Negative.   ?HENT: Negative.    ?Eyes: Negative.   ?Respiratory: Negative.    ?Cardiovascular: Negative.   ?Gastrointestinal: Negative.   ?Genitourinary: Negative.   ?Musculoskeletal: Negative.   ?Skin: Negative.   ?Neurological: Negative.   ?Psychiatric/Behavioral: Negative.    ?All other systems reviewed and are negative. ? ?  ?Objective:  ?  ? ?BP (!) 151/118   Pulse 60   Temp 98.2 ?F (36.8 ?C) (Temporal)   Resp 18   Ht 5\' 7"  (1.702 m)   Wt 246 lb 12.8 oz (111.9 kg)   SpO2 99%   BMI 38.65 kg/m?  ? ?Wt Readings from Last 3 Encounters:  ?08/02/21 246 lb 12.8 oz (111.9 kg)  ?06/07/21 243 lb (110.2 kg)  ?11/23/20 242 lb 12.8 oz (110.1 kg)  ? ?Physical Exam ?Constitutional:   ?   General: He is not in acute distress. ?   Appearance: Normal appearance. He is normal weight. He is not ill-appearing, toxic-appearing or diaphoretic.  ?Cardiovascular:  ?   Rate and Rhythm: Normal rate and regular rhythm.  ?   Heart sounds: Normal heart sounds. No  murmur heard. ?  No friction rub. No gallop.  ?Pulmonary:  ?   Effort: Pulmonary effort is normal. No respiratory distress.  ?   Breath sounds: Normal breath sounds. No stridor. No wheezing, rhonchi or rales.  ?Chest:  ?   Chest wall: No tenderness.  ?Neurological:  ?   General: No focal deficit present.  ?   Mental Status: He is alert and oriented to person, place, and time. Mental status is at baseline.  ?Psychiatric:     ?   Mood and Affect: Mood normal.     ?   Behavior: Behavior normal.     ?   Thought Content: Thought content normal.     ?   Judgment: Judgment normal.  ? ? ?No results found for any visits on 08/02/21. ? ? ? ?The 10-year ASCVD risk score (Arnett DK, et al., 2019) is:  11.8% ? ?  ?Assessment & Plan:  ? ?Problem List Items Addressed This Visit   ? ?  ? Cardiovascular and Mediastinum  ? Essential hypertension - Primary  ? Relevant Medications  ? lisinopril (ZESTRIL) 40 MG tablet  ? hydrochlorothiazide (HYDRODIURIL) 25 MG tablet  ? atorvastatin (LIPITOR) 40 MG tablet  ? Other Relevant Orders  ? CBC with Differential/Platelet  ? Comprehensive metabolic panel  ?  ? Digestive  ? Gastroesophageal reflux disease  ? Relevant Medications  ? omeprazole (PRILOSEC) 20 MG capsule  ? ?Other Visit Diagnoses   ? ? Panic attacks      ? Relevant Medications  ? ALPRAZolam (NIRAVAM) 0.5 MG dissolvable tablet  ? buPROPion ER (WELLBUTRIN SR) 100 MG 12 hr tablet  ? busPIRone (BUSPAR) 5 MG tablet  ? Mixed hyperlipidemia      ? Relevant Medications  ? lisinopril (ZESTRIL) 40 MG tablet  ? hydrochlorothiazide (HYDRODIURIL) 25 MG tablet  ? atorvastatin (LIPITOR) 40 MG tablet  ? Other Relevant Orders  ? Lipid panel  ? Depression with anxiety      ? Relevant Medications  ? ALPRAZolam (NIRAVAM) 0.5 MG dissolvable tablet  ? buPROPion ER (WELLBUTRIN SR) 100 MG 12 hr tablet  ? busPIRone (BUSPAR) 5 MG tablet  ? Need for diphtheria-tetanus-pertussis (Tdap) vaccine      ? Relevant Orders  ? Tdap vaccine greater than or equal to 7yo IM  ? Colon cancer screening      ? Relevant Orders  ? Ambulatory referral to Gastroenterology  ? Encounter for screening for other viral diseases      ? Relevant Orders  ? Hepatitis C Antibody  ? HIV antibody (with reflex)  ? Prediabetes      ? Relevant Orders  ? Hemoglobin A1c  ? ?  ? ? ?Meds ordered this encounter  ?Medications  ? lisinopril (ZESTRIL) 40 MG tablet  ?  Sig: Take 1 tablet (40 mg total) by mouth daily.  ?  Dispense:  90 tablet  ?  Refill:  3  ?  Order Specific Question:   Supervising Provider  ?  Answer:   Neva Seat, JEFFREY R [[id number]]  ? hydrochlorothiazide (HYDRODIURIL) 25 MG tablet  ?  Sig: Take 1 tablet (25 mg total) by mouth daily.  ?  Dispense:  90 tablet  ?  Refill:  3  ?   Order Specific Question:   Supervising Provider  ?  Answer:   Neva Seat, JEFFREY R [[id number]]  ? ALPRAZolam (NIRAVAM) 0.5 MG dissolvable tablet  ?  Sig: DISSOLVE 1 TABLET(0.5 MG) ON THE TONGUE AT BEDTIME AS NEEDED  FOR ANXIETY  ?  Dispense:  30 tablet  ?  Refill:  0  ?  Order Specific Question:   Supervising Provider  ?  Answer:   Neva Seat, JEFFREY R [[id number]]  ? atorvastatin (LIPITOR) 40 MG tablet  ?  Sig: Take 1 tablet (40 mg total) by mouth daily.  ?  Dispense:  90 tablet  ?  Refill:  3  ?  Order Specific Question:   Supervising Provider  ?  Answer:   Neva Seat, JEFFREY R [[id number]]  ? buPROPion ER (WELLBUTRIN SR) 100 MG 12 hr tablet  ?  Sig: Take 1 tablet (100 mg total) by mouth daily.  ?  Dispense:  180 tablet  ?  Refill:  3  ?  Order Specific Question:   Supervising Provider  ?  Answer:   Neva Seat, JEFFREY R [[id number]]  ? busPIRone (BUSPAR) 5 MG tablet  ?  Sig: Take 1 tablet (5 mg total) by mouth 3 (three) times daily.  ?  Dispense:  270 tablet  ?  Refill:  3  ?  Order Specific Question:   Supervising Provider  ?  Answer:   Neva Seat, JEFFREY R [[id number]]  ? omeprazole (PRILOSEC) 20 MG capsule  ?  Sig: TAKE ONE CAPSULE BY MOUTH DAILY 1 HOUR PRIOR TO LARGEST MEAL  ?  Dispense:  90 capsule  ?  Refill:  3  ?  Order Specific Question:   Supervising Provider  ?  Answer:   Neva Seat, JEFFREY R [[id number]]  ? ? ?Return in about 3 weeks (around 08/23/2021) for nuse visit BP check, in 6 mo for CPE and labs..  ? ?PLAN ?Increase antihypertensives to lisinopril 40mg  po qd, hctz 25mg  po qd. Recheck with nurse visit in 3 weeks. Visit with provider in 6 mo if wnl. ?Labs collected. Will follow up with the patient as warranted. ?Refill other meds as above ?Refer to GI for colonoscopy. ?Patient encouraged to call clinic with any questions, comments, or concerns. ? ? , NP ?

## 2021-08-03 LAB — HIV ANTIBODY (ROUTINE TESTING W REFLEX): HIV 1&2 Ab, 4th Generation: NONREACTIVE

## 2021-08-03 LAB — HEPATITIS C ANTIBODY
Hepatitis C Ab: NONREACTIVE
SIGNAL TO CUT-OFF: 0.15 (ref <1.00)

## 2021-08-23 ENCOUNTER — Ambulatory Visit: Payer: Federal, State, Local not specified - PPO

## 2021-09-06 ENCOUNTER — Encounter: Payer: Self-pay | Admitting: Physical Medicine & Rehabilitation

## 2021-09-06 ENCOUNTER — Encounter
Payer: Federal, State, Local not specified - PPO | Attending: Physical Medicine & Rehabilitation | Admitting: Physical Medicine & Rehabilitation

## 2021-09-06 VITALS — BP 128/91 | HR 93 | Temp 98.6°F | Ht 67.0 in | Wt 235.0 lb

## 2021-09-06 DIAGNOSIS — G894 Chronic pain syndrome: Secondary | ICD-10-CM | POA: Diagnosis not present

## 2021-09-06 DIAGNOSIS — Z79891 Long term (current) use of opiate analgesic: Secondary | ICD-10-CM | POA: Diagnosis not present

## 2021-09-06 DIAGNOSIS — Z5181 Encounter for therapeutic drug level monitoring: Secondary | ICD-10-CM | POA: Diagnosis not present

## 2021-09-06 NOTE — Progress Notes (Signed)
Left L3 Lumbar transforaminal epidural steroid injection under fluoroscopic guidance with contrast enhancement  Indication: Lumbosacral radiculitis is not relieved by medication management or other conservative care and interfering with self-care and mobility.   Informed consent was obtained after describing risk and benefits of the procedure with the patient, this includes bleeding, bruising, infection, paralysis and medication side effects.  The patient wishes to proceed and has given written consent.  Patient was placed in prone position.  The lumbar area was marked and prepped with Betadine.  It was entered with a 25-gauge 1-1/2 inch needle and one mL of 1% lidocaine was injected into the skin and subcutaneous tissue.  Then a 22-gauge 5in spinal needle was inserted into the Left L3-4  intervertebral foramen under AP, lateral, and oblique view.  Once needle tip was within the foramen on lateral views an dnor exceeding 6 o clock position on th epedical on AP viewed Isovue 200 was inected x 2ml Then a solution containing one mL of 10 mg per mL dexamethasone and 2 mL of 1% lidocaine was injected.  The patient tolerated procedure well.  Post procedure instructions were given.  Please see post procedure form. 

## 2021-09-06 NOTE — Progress Notes (Signed)
?  PROCEDURE RECORD ?[facility] ? ? ?Name: Nicholas Vargas ?DOB:July 04, 1970 ?MRN: [id number] ? ?Date:09/06/2021  Physician: Claudette Laws, MD   ? ?Nurse/CMA: Nedra Hai, CMA ? ?Allergies: No Known Allergies ? ?Consent Signed: Yes.    Is patient diabetic? No.  CBG today? . ? ? ?Pregnant: No. LMP: No LMP for male patient. (age 49-55) ? ?Anticoagulants: no ?Anti-inflammatory: yes (Methocarbomal) ?Antibiotics: no ? ?Procedure: Left L3 Transforaminal Epidural Steroid Injection  Position: Prone ?Start Time: 12:00 pm  End Time: 12:09 pm  Fluoro Time: 37 ? ?RN/CMA Nedra Hai, CMA Nedra Hai, CMA    ?Time 11:40 am 12:15 pm    ?BP 128/91 135/92    ?Pulse 93 93    ?Respirations 16 16    ?O2 Sat 96 95    ?S/S 6 6    ?Pain Level 8/10 7/10    ? ?D/C home with Bus, [name], D/C instructions reviewed, and sits independently. ? ? ? ? ? ? ? ?

## 2021-09-06 NOTE — Patient Instructions (Signed)

## 2021-09-10 LAB — TOXASSURE SELECT,+ANTIDEPR,UR

## 2021-09-12 ENCOUNTER — Telehealth: Payer: Self-pay | Admitting: *Deleted

## 2021-09-12 NOTE — Telephone Encounter (Signed)
Urine drug screen for this encounter is consistent for prescribed medication but is also positive for alcohol. Per Dr Wynn Banker, a formal warning will be sent about combining alcohol with narcotics. It will be sent via MyChart and USPS.

## 2021-09-27 ENCOUNTER — Ambulatory Visit: Payer: Federal, State, Local not specified - PPO | Admitting: Registered Nurse

## 2021-10-07 ENCOUNTER — Other Ambulatory Visit: Payer: Self-pay

## 2021-10-07 DIAGNOSIS — I10 Essential (primary) hypertension: Secondary | ICD-10-CM

## 2021-10-07 MED ORDER — LISINOPRIL 40 MG PO TABS: 40.0000 mg | ORAL_TABLET | Freq: Every day | ORAL | 3 refills | Status: DC

## 2021-10-18 ENCOUNTER — Encounter
Payer: Federal, State, Local not specified - PPO | Attending: Physical Medicine & Rehabilitation | Admitting: Physical Medicine & Rehabilitation

## 2021-10-18 DIAGNOSIS — Z5181 Encounter for therapeutic drug level monitoring: Secondary | ICD-10-CM | POA: Insufficient documentation

## 2021-10-18 DIAGNOSIS — Z79891 Long term (current) use of opiate analgesic: Secondary | ICD-10-CM | POA: Insufficient documentation

## 2021-10-18 DIAGNOSIS — G894 Chronic pain syndrome: Secondary | ICD-10-CM | POA: Insufficient documentation

## 2021-11-01 ENCOUNTER — Ambulatory Visit: Payer: Federal, State, Local not specified - PPO | Admitting: Registered Nurse

## 2021-12-13 ENCOUNTER — Encounter: Payer: Federal, State, Local not specified - PPO | Admitting: Physical Medicine & Rehabilitation

## 2021-12-20 ENCOUNTER — Encounter: Payer: Self-pay | Admitting: Psychology

## 2021-12-20 ENCOUNTER — Encounter: Payer: Federal, State, Local not specified - PPO | Admitting: Psychology

## 2022-01-24 ENCOUNTER — Other Ambulatory Visit: Payer: Self-pay | Admitting: Physical Medicine & Rehabilitation

## 2022-01-24 DIAGNOSIS — M545 Low back pain, unspecified: Secondary | ICD-10-CM

## 2022-02-07 ENCOUNTER — Encounter
Payer: Federal, State, Local not specified - PPO | Attending: Physical Medicine & Rehabilitation | Admitting: Physical Medicine & Rehabilitation

## 2022-02-07 ENCOUNTER — Encounter: Payer: Federal, State, Local not specified - PPO | Admitting: Registered Nurse

## 2022-03-07 ENCOUNTER — Encounter
Payer: Federal, State, Local not specified - PPO | Attending: Physical Medicine & Rehabilitation | Admitting: Physical Medicine & Rehabilitation

## 2022-03-07 ENCOUNTER — Encounter: Payer: Self-pay | Admitting: Physical Medicine & Rehabilitation

## 2022-03-07 VITALS — BP 139/99 | HR 116 | Ht 67.0 in

## 2022-03-07 DIAGNOSIS — G894 Chronic pain syndrome: Secondary | ICD-10-CM | POA: Diagnosis not present

## 2022-03-07 DIAGNOSIS — Z5181 Encounter for therapeutic drug level monitoring: Secondary | ICD-10-CM | POA: Insufficient documentation

## 2022-03-07 DIAGNOSIS — Z79891 Long term (current) use of opiate analgesic: Secondary | ICD-10-CM | POA: Insufficient documentation
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
## 2022-03-10 LAB — TOXASSURE SELECT,+ANTIDEPR,UR

## 2022-03-15 ENCOUNTER — Telehealth: Payer: Self-pay | Admitting: *Deleted

## 2022-03-15 NOTE — Telephone Encounter (Signed)
Urine drug screen for this encounter is consistent for prescribed medication 

## 2022-06-08 ENCOUNTER — Encounter: Payer: Federal, State, Local not specified - PPO | Admitting: Physical Medicine & Rehabilitation

## 2022-06-20 ENCOUNTER — Encounter: Payer: Self-pay | Admitting: Physical Medicine & Rehabilitation

## 2022-06-20 ENCOUNTER — Encounter
Payer: Federal, State, Local not specified - PPO | Attending: Physical Medicine & Rehabilitation | Admitting: Physical Medicine & Rehabilitation

## 2022-06-20 VITALS — BP 128/88 | HR 108 | Ht 67.0 in | Wt 241.0 lb

## 2022-06-20 DIAGNOSIS — M533 Sacrococcygeal disorders, not elsewhere classified: Secondary | ICD-10-CM | POA: Diagnosis not present

## 2022-06-20 NOTE — Progress Notes (Signed)
   Subjective:    Patient ID: Nicholas Vargas, male    DOB: 1971-02-23, 52 y.o.   MRN: [id number]  HPI    Review of Systems     Objective:   Physical Exam        Assessment & Plan:
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
## 2022-07-10 ENCOUNTER — Encounter: Payer: Federal, State, Local not specified - PPO | Admitting: Psychology

## 2022-07-25 ENCOUNTER — Encounter: Payer: Self-pay | Admitting: Physical Medicine & Rehabilitation

## 2022-07-25 ENCOUNTER — Encounter
Payer: Federal, State, Local not specified - PPO | Attending: Physical Medicine & Rehabilitation | Admitting: Physical Medicine & Rehabilitation

## 2022-07-25 VITALS — Temp 98.6°F

## 2022-07-25 DIAGNOSIS — G8929 Other chronic pain: Secondary | ICD-10-CM | POA: Diagnosis not present

## 2022-07-25 DIAGNOSIS — M533 Sacrococcygeal disorders, not elsewhere classified: Secondary | ICD-10-CM | POA: Diagnosis not present

## 2022-07-25 DIAGNOSIS — M545 Low back pain, unspecified: Secondary | ICD-10-CM | POA: Insufficient documentation

## 2022-07-25 MED ORDER — TRAMADOL HCL 50 MG PO TABS: ORAL_TABLET | ORAL | 5 refills | Status: DC

## 2022-07-25 MED ORDER — LIDOCAINE HCL 1 % IJ SOLN
5.0000 mL | Freq: Once | INTRAMUSCULAR | Status: AC
  Administered 2022-07-25: 5 mL

## 2022-07-25 MED ORDER — BETAMETHASONE SOD PHOS & ACET 6 (3-3) MG/ML IJ SUSP
24.0000 mg | Freq: Once | INTRAMUSCULAR | Status: AC
  Administered 2022-07-25: 24 mg via INTRAMUSCULAR

## 2022-07-25 MED ORDER — LIDOCAINE HCL (PF) 2 % IJ SOLN
4.0000 mL | Freq: Once | INTRAMUSCULAR | Status: AC
  Administered 2022-07-25: 4 mL

## 2022-07-25 MED ORDER — IOHEXOL 180 MG/ML  SOLN
3.0000 mL | Freq: Once | INTRAMUSCULAR | Status: AC
  Administered 2022-07-25: 3 mL via INTRAVENOUS

## 2022-07-25 NOTE — Patient Instructions (Signed)
Sacroiliac injection was performed today. A combination of numbing medicine (lidocaine) plus a cortisone medicine (betamethasone) was injected. The injection was done under x-ray guidance. This procedure has been performed to help reduce low back and buttocks pain as well as potentially hip pain. The duration of this injection is variable lasting from hours to  Months. It may repeated if needed. 

## 2022-07-25 NOTE — Progress Notes (Signed)
Bilateral sacroiliac injections under fluoroscopic guidance  Indication: Low back and buttocks pain not relieved by medication management and other conservative care.  Informed consent was obtained after describing risks and benefits of the procedure with the patient, this includes bleeding, bruising, infection, paralysis and medication side effects. The patient wishes to proceed and has given written consent. The patient was placed in a prone position. The lumbar and sacral area was marked and prepped with Betadine. A 25-gauge 1-1/2 inch needle was inserted into the skin and subcutaneous tissue and 1 mL of 1% lidocaine was injected into each side. Then a 25-gauge 3 inch spinal needle was inserted under fluoroscopic guidance into the left sacroiliac joint. AP and lateral images were utilized. Omnipaque 180x0.5 mL under live fluoroscopy demonstrated no intravascular uptake. Then a solution containing one ML of 6 mg per mL Celestone in 2 ML of 2% lidocaine MPF was injected x1.5 mL. This same procedure was repeated on the right side using the same needle, injectate, and technique. Patient tolerated the procedure well. Post procedure instructions were given. Please see post procedure form.  Meds: Celestone 6mg Omnipaque 1ml Lidocaine 1% 2ml Lidocaine 2% PF 2 ml  

## 2022-07-25 NOTE — Progress Notes (Signed)
  PROCEDURE RECORD [facility]   Name: Nicholas Vargas DOB:07-13-1970 MRN: [id number]  Date:07/25/2022  Physician: Alysia Penna, MD    Nurse/CMA: Devaeh Amadi S  Allergies: No Known Allergies  Consent Signed: Yes.    Is patient diabetic? No.  CBG today? N/a  Pregnant: No. LMP: No LMP for male patient. (age 50-55)  Anticoagulants: no Anti-inflammatory: no Antibiotics: no  Procedure: Bilateral Steroid injection  Position: Prone Start Time: 10:43  End Time: 10:52  Fluoro Time: 50  RN/CMA Zack Seal S    Time 10:23 10:56    BP 141/91 144/69    Pulse 78 87    Respirations 16 16    O2 Sat 96 93    S/S 6 6    Pain Level 9/10 4/10     D/C home with friend, [name], D/C instructions reviewed, and sits independently.

## 2022-09-05 ENCOUNTER — Ambulatory Visit: Payer: Federal, State, Local not specified - PPO | Admitting: Registered Nurse

## 2022-11-22 ENCOUNTER — Encounter (INDEPENDENT_AMBULATORY_CARE_PROVIDER_SITE_OTHER): Payer: Self-pay

## 2022-12-19 ENCOUNTER — Encounter: Payer: Self-pay | Admitting: Internal Medicine

## 2022-12-19 ENCOUNTER — Ambulatory Visit: Payer: Federal, State, Local not specified - PPO | Admitting: Internal Medicine

## 2022-12-19 VITALS — BP 132/81 | HR 80 | Temp 99.2°F | Ht 67.0 in | Wt 246.8 lb

## 2022-12-19 DIAGNOSIS — Z119 Encounter for screening for infectious and parasitic diseases, unspecified: Secondary | ICD-10-CM | POA: Diagnosis not present

## 2022-12-19 DIAGNOSIS — R29818 Other symptoms and signs involving the nervous system: Secondary | ICD-10-CM

## 2022-12-19 DIAGNOSIS — F32A Depression, unspecified: Secondary | ICD-10-CM | POA: Insufficient documentation

## 2022-12-19 DIAGNOSIS — R638 Other symptoms and signs concerning food and fluid intake: Secondary | ICD-10-CM | POA: Insufficient documentation

## 2022-12-19 DIAGNOSIS — F4310 Post-traumatic stress disorder, unspecified: Secondary | ICD-10-CM

## 2022-12-19 DIAGNOSIS — B351 Tinea unguium: Secondary | ICD-10-CM | POA: Insufficient documentation

## 2022-12-19 DIAGNOSIS — Z125 Encounter for screening for malignant neoplasm of prostate: Secondary | ICD-10-CM

## 2022-12-19 DIAGNOSIS — M5136 Other intervertebral disc degeneration, lumbar region: Secondary | ICD-10-CM

## 2022-12-19 DIAGNOSIS — Z1211 Encounter for screening for malignant neoplasm of colon: Secondary | ICD-10-CM

## 2022-12-19 LAB — CBC WITH DIFFERENTIAL/PLATELET
Basophils Absolute: 0 10*3/uL (ref 0.0–0.1)
Basophils Relative: 0.8 % (ref 0.0–3.0)
Eosinophils Absolute: 0 10*3/uL (ref 0.0–0.7)
Eosinophils Relative: 0.7 % (ref 0.0–5.0)
HCT: 46.8 % (ref 39.0–52.0)
Hemoglobin: 15.1 g/dL (ref 13.0–17.0)
Lymphocytes Relative: 51.7 % — ABNORMAL HIGH (ref 12.0–46.0)
Lymphs Abs: 2.1 10*3/uL (ref 0.7–4.0)
MCHC: 32.3 g/dL (ref 30.0–36.0)
MCV: 91.6 fl (ref 78.0–100.0)
Monocytes Absolute: 0.4 10*3/uL (ref 0.1–1.0)
Monocytes Relative: 10.1 % (ref 3.0–12.0)
Neutro Abs: 1.5 10*3/uL (ref 1.4–7.7)
Neutrophils Relative %: 36.7 % — ABNORMAL LOW (ref 43.0–77.0)
Platelets: 212 10*3/uL (ref 150.0–400.0)
RBC: 5.11 Mil/uL (ref 4.22–5.81)
RDW: 13.3 % (ref 11.5–15.5)
WBC: 4 10*3/uL (ref 4.0–10.5)

## 2022-12-19 LAB — COMPREHENSIVE METABOLIC PANEL
ALT: 50 U/L (ref 0–53)
AST: 27 U/L (ref 0–37)
Albumin: 4.5 g/dL (ref 3.5–5.2)
Alkaline Phosphatase: 49 U/L (ref 39–117)
BUN: 15 mg/dL (ref 6–23)
CO2: 27 mEq/L (ref 19–32)
Calcium: 9.9 mg/dL (ref 8.4–10.5)
Chloride: 101 mEq/L (ref 96–112)
Creatinine, Ser: 1.11 mg/dL (ref 0.40–1.50)
GFR: 77.67 mL/min (ref >60.00)
Glucose, Bld: 105 mg/dL — ABNORMAL HIGH (ref 70–99)
Potassium: 4.5 mEq/L (ref 3.5–5.1)
Sodium: 137 mEq/L (ref 135–145)
Total Bilirubin: 0.7 mg/dL (ref 0.2–1.2)
Total Protein: 7.3 g/dL (ref 6.0–8.3)

## 2022-12-19 LAB — LIPID PANEL
Cholesterol: 275 mg/dL — ABNORMAL HIGH (ref 0–200)
HDL: 41.8 mg/dL (ref >39.00)
Total CHOL/HDL Ratio: 7
Triglycerides: 477 mg/dL — ABNORMAL HIGH (ref 0.0–149.0)

## 2022-12-19 LAB — LDL CHOLESTEROL, DIRECT: Direct LDL: 133 mg/dL

## 2022-12-19 LAB — TSH: TSH: 1.59 u[IU]/mL (ref 0.35–5.50)

## 2022-12-19 LAB — HEMOGLOBIN A1C: Hgb A1c MFr Bld: 6 % (ref 4.6–6.5)

## 2022-12-19 LAB — PSA: PSA: 0.71 ng/mL (ref 0.10–4.00)

## 2022-12-19 MED ORDER — BUPROPION HCL ER (SR) 100 MG PO TB12
100.0000 mg | ORAL_TABLET | Freq: Two times a day (BID) | ORAL | 3 refills | Status: DC
Start: 2022-12-19 — End: 2024-03-02

## 2022-12-19 MED ORDER — CICLOPIROX OLAMINE 0.77 % EX CREA
TOPICAL_CREAM | Freq: Two times a day (BID) | CUTANEOUS | 3 refills | Status: AC
Start: 2022-12-19 — End: ?

## 2022-12-19 NOTE — Assessment & Plan Note (Signed)
MRI copied and updated problem overview for this problem to improve longitudinal management but this was not discussed today

## 2022-12-19 NOTE — Assessment & Plan Note (Signed)
We had brief discussion where I encouraged patient to use [employer] App to self screen, to help him build up to it.

## 2022-12-19 NOTE — Assessment & Plan Note (Signed)
Not discussed  but Body mass index is 38.65 kg/m.
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
## 2022-12-19 NOTE — Assessment & Plan Note (Signed)
He has a chronic fungal infection of the nails, previously treated with limited success using topical antifungal. Oral antifungal treatment is deferred due to potential interactions with alcohol and depression medications. A topical antifungal is prescribed to manage symptoms in the interim.

## 2022-12-19 NOTE — Assessment & Plan Note (Signed)
Severe depression, history of trauma, and possible PTSD characterize his condition. He has discontinued Wellbutrin, leading to worsening symptoms, and uses alcohol as a coping mechanism. We will resume Wellbutrin BID and provide a year's supply. A follow-up is scheduled in 2-4 weeks to assess medication response and consider additional treatment if necessary. A referral to an EMDR therapist for trauma-focused therapy is made. A sleep study to assess for sleep apnea, which may be contributing to depression, is considered. Advised patient against sudden discussion alcohol which he has been using to cope. He denies self-harm risk despite high Patient Health Questionnaire (PHQ) depression screen survey.

## 2022-12-19 NOTE — Assessment & Plan Note (Signed)
Counseled on lack of effective medication(s) Provided full listing and behavioral health referral for all [employer] Desensitization and Reprocessing (EMDR) providers.

## 2022-12-19 NOTE — Patient Instructions (Addendum)
VISIT SUMMARY:  During our visit, we discussed your ongoing struggle with depression and your concerns about your mental health. We also talked about your persistent issue with your fingernails, which you believe to be a fungal infection. Additionally, we discussed your significant alcohol intake, which you attribute to your struggle with depression. You also mentioned your history of hypertension and abnormal BMI.  YOUR PLAN:  -MAJOR DEPRESSIVE DISORDER: You have been diagnosed with Major Depressive Disorder, which is a mental health disorder characterized by persistent feelings of sadness and a lack of interest in outside stimuli. We will resume your previous medication, Wellbutrin, and provide a year's supply. We will also schedule a follow-up appointment in 2-4 weeks to assess your response to the medication and consider additional treatment if necessary. We are also referring you to a therapist who specializes in Eye Movement Desensitization and Reprocessing (EMDR) therapy for trauma-focused therapy. We are considering a sleep study to assess for sleep apnea, which may be contributing to your depression.  -ONYCHOMYCOSIS: You have a chronic fungal infection of the nails, which is known as onychomycosis. We have decided to defer oral antifungal treatment due to potential interactions with alcohol and depression medications. Instead, we have prescribed a topical antifungal to manage your symptoms in the interim.  -GENERAL HEALTH MAINTENANCE: We have ordered comprehensive blood work, including diabetes screening and Prostate-Specific Antigen (PSA) test. We have scheduled a follow-up appointment in 2 weeks to review your lab results and reassess your depression treatment.  INSTRUCTIONS:  Please ensure to take your prescribed medication, Wellbutrin, as directed. Apply the prescribed topical antifungal to your nails as directed. Please also ensure to attend your follow-up appointments and your referral  appointment with the EMDR therapist. It is important to monitor your alcohol intake and try to reduce it, as it can interact with your medications and exacerbate your depression. Please also ensure to attend your sleep study appointment if it is scheduled. Please call for Eye Movement Desensitization and Reprocessing (EMDR):  [facility] Address: [address], [place], [place], [address] Phone: [phone] Services: Individual Counseling, Family Therapy, Couples Counseling, Trauma Therapy, Stress Management, Depression Treatment, Anxiety Treatment, and Grief Counseling   Christophe Louis, [facility] Address: [address] [place], [place], [place], [address] Phone: [phone] Specializes in EMDR, PTSD, anxiety, cognitive behavioral therapy, and other disorders   [facility] Address: [address] [place], [place], [place], [address] Phone: [phone] Offers professional counseling specializing in Complex Trauma and Dissociative Disorders, mood disorders, personality disorders, and EMDR 3.  [facility] Address: [address], [place], [place], [address] Phone: [phone] Specializes in trauma, addiction, and perinatal therapy services 4.  [facility] [address] [place], [place] [address] [phone]

## 2022-12-20 LAB — HEPATITIS C ANTIBODY: Hepatitis C Ab: NONREACTIVE

## 2022-12-20 LAB — HIV ANTIBODY (ROUTINE TESTING W REFLEX): HIV 1&2 Ab, 4th Generation: NONREACTIVE

## 2022-12-20 NOTE — Progress Notes (Signed)
I've reviewed the results and they are all essentially normal, except for cholesterol levels.  HDL Cholesterol was 41.80 7: this is good cholesterol we want to raise the levels as high as possible.  The longest lived individuals typically have levels over 100. LDL cholesterol levels were LDLcalc No results found for requested labs within last 1095 days. this is bad cholesterol that forms plaques in the arteries that cause strokes and heart attacks, we want the levels as low as possible.  People with history of cardiovascular disease take medicine to get to a goal of 50, but lower might be even better. Triglycerides Levels were 477.0 Triglyceride is over 400; calculations on Lipids are invalid. these contribute to cardiovascular disease and gallstones, we want the levels as low as possible.  Low carb diets and weight loss are very effective at keeping this low.  While we'll discuss the details at your next appointment, we wanted to share some initial thoughts and next steps.  Diet: Focus on avoiding foods that have saturated fats, high cholesterol, or high levels of carbohydrates like sugar, starch, and grains.  Even more important, be sure to eat lots of healthy omega-3 unsaturated fats like Extra Virgin Olive Oil, fatty fish, nuts, and avocados.  Its safe to take omega-3 supplements. Exercise: Regular physical activity is crucial for overall health and can benefit your cholesterol. Weight Management: If you're overweight or obese, reaching a healthy weight can positively impact your cholesterol. Potential Medication: Depending on your individual risk factors, we might explore the benefits of medications to further manage your cholesterol and heart health.  We'll discuss this in detail at your next visit.  Collaboration is Key: We value your active participation in your care plan. At your next appointment, we can discuss your questions and preferences to create a personalized approach that works best  for you.  Next Steps: We encourage you to make any suggested lifestyle changes as described above We encourage you to get cholesterol rechecked in 6-12 months If you're interested, we could have an extra appointment to: (1) Review your cholesterol results in more detail. (2) Discuss personalized lifestyle changes and potential medication options. (3) Answer your questions and address any concerns you may have. (4) Discuss additional cholesterol testing options and cardiac risk evaluation tests that are available  In the meantime, you can find some resources on heart-healthy living at [email or web address] Please let us know if you have any questions before your next appointment.

## 2023-01-03 ENCOUNTER — Ambulatory Visit: Payer: Federal, State, Local not specified - PPO | Admitting: Internal Medicine

## 2023-01-16 ENCOUNTER — Ambulatory Visit: Payer: Federal, State, Local not specified - PPO | Admitting: Internal Medicine

## 2023-01-16 ENCOUNTER — Encounter: Payer: Self-pay | Admitting: Internal Medicine

## 2023-01-16 VITALS — BP 145/93 | HR 79 | Temp 97.9°F | Ht 67.0 in | Wt 249.4 lb

## 2023-01-16 DIAGNOSIS — E782 Mixed hyperlipidemia: Secondary | ICD-10-CM

## 2023-01-16 DIAGNOSIS — R638 Other symptoms and signs concerning food and fluid intake: Secondary | ICD-10-CM

## 2023-01-16 DIAGNOSIS — E8881 Metabolic syndrome: Secondary | ICD-10-CM | POA: Diagnosis not present

## 2023-01-16 DIAGNOSIS — F32A Depression, unspecified: Secondary | ICD-10-CM | POA: Diagnosis not present

## 2023-01-16 DIAGNOSIS — I10 Essential (primary) hypertension: Secondary | ICD-10-CM

## 2023-01-16 DIAGNOSIS — K219 Gastro-esophageal reflux disease without esophagitis: Secondary | ICD-10-CM

## 2023-01-16 MED ORDER — TOPIRAMATE 25 MG PO TABS
25.0000 mg | ORAL_TABLET | Freq: Two times a day (BID) | ORAL | 5 refills | Status: AC
Start: 2023-01-16 — End: ?

## 2023-01-16 MED ORDER — OMEPRAZOLE 20 MG PO CPDR
DELAYED_RELEASE_CAPSULE | ORAL | 3 refills | Status: DC
Start: 2023-01-16 — End: 2024-02-07

## 2023-01-16 MED ORDER — ATORVASTATIN CALCIUM 40 MG PO TABS
40.0000 mg | ORAL_TABLET | Freq: Every day | ORAL | 3 refills | Status: DC
Start: 2023-01-16 — End: 2024-02-07

## 2023-01-16 MED ORDER — AMLODIPINE BESYLATE 2.5 MG PO TABS
2.5000 mg | ORAL_TABLET | Freq: Every day | ORAL | 3 refills | Status: DC
Start: 2023-01-16 — End: 2024-03-02

## 2023-01-16 MED ORDER — ESCITALOPRAM OXALATE 10 MG PO TABS: 10.0000 mg | ORAL_TABLET | Freq: Every day | ORAL | 1 refills | Status: DC

## 2023-01-16 MED ORDER — LISINOPRIL 40 MG PO TABS
40.0000 mg | ORAL_TABLET | Freq: Every day | ORAL | 3 refills | Status: DC
Start: 2023-01-16 — End: 2024-03-02

## 2023-01-16 NOTE — Assessment & Plan Note (Addendum)
Severe depression has worsened after stopping Wellbutrin, with alcohol and food used as coping mechanisms. We will continue Wellbutrin, add Lexapro, encourage CHAT GPT counseling, attempt to arrange more affordable counseling, and follow up in 1 month to assess depression management. Encouraged patient again to try ChatGPT counseling.  Eye Movement Desensitization and Reprocessing (EMDR) was too expensive and behavioral health counselor likely to be will try again with special referral requests We will continue Wellbutrin BID and provide a year's supply in case regular follow up is not feasible for him.   A follow-up is recommended  in 2-4 weeks to assess medication response and consider additional treatment if necessary
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
## 2023-01-16 NOTE — Assessment & Plan Note (Signed)
They are prediabetic with weight concentrated in the abdominal area, highlighting the risk of heart attack and the benefits of a low carb diet and exercise. We will encourage a low carb diet and exercise while continuing to monitor blood sugar levels.

## 2023-01-16 NOTE — Assessment & Plan Note (Signed)
Persistently elevated blood pressure readings have been noted, emphasizing the risk of stroke and underscoring the importance of consistent blood pressure control. They are currently on Lisinopril and have ceased Amlodipine due to leg swelling. We will reintroduce Amlodipine at a low dose to manage hypertension and schedule a follow-up in 1-3 months to assess blood pressure control and monitor for side effects.

## 2023-01-16 NOTE — Assessment & Plan Note (Signed)
They are overweight with a desire to lose weight. The use of Topiramate to aid in weight loss was discussed. We will start Topiramate, encourage a low carb diet, and exercise.

## 2023-01-16 NOTE — Patient Instructions (Signed)
VISIT SUMMARY:  During our visit, we discussed your concerns about high blood pressure, cholesterol levels, depression, and weight management. We also talked about your persistent itching in the hands and your history of back pain. We agreed on a plan to manage these issues, which includes changes to your medication regimen and lifestyle modifications.  YOUR PLAN:  -HIGH BLOOD PRESSURE: Your blood pressure has been consistently high, which increases your risk of stroke. We will reintroduce Amlodipine at a low dose to help control your blood pressure. This medication was previously stopped due to leg swelling, but we will monitor for this side effect.  -HIGH CHOLESTEROL: Your cholesterol levels are higher than last year, which increases your risk of heart attack or stroke. We will restart Atorvastatin, a medication that helps lower cholesterol levels.  -PREDIABETES AND WEIGHT MANAGEMENT: You are prediabetic and carry weight in your abdominal area, which can increase your risk of heart disease. We discussed the benefits of a low-carb diet and regular exercise. We will also start Topiramate, a medication that can help with weight loss by making sugar taste less appealing.  -DEPRESSION: Your depression has worsened since stopping Wellbutrin. We will continue Wellbutrin, add Lexapro, and explore more affordable counseling options. We also discussed the use of CHAT GPT counseling as a potential resource.  -GENERAL HEALTH MAINTENANCE: We will continue Omeprazole for your GERD (a condition that causes stomach acid to flow back into your esophagus), check your PT (a blood test that measures how long it takes your blood to clot), and consider a sleep study if you show symptoms of sleep apnea (a condition where your breathing repeatedly stops and starts during sleep).  INSTRUCTIONS:  Please start taking Amlodipine and Atorvastatin as directed. Also, start taking Topiramate for weight management. Continue  taking Wellbutrin and start Lexapro for depression. Remember to follow a low-carb diet and engage in regular exercise, such as walking. We will schedule a follow-up visit in 1-3 months to check your blood pressure and cholesterol levels, and in 1 month to assess your depression management. If you have any questions or concerns, please don't hesitate to contact the office.

## 2023-01-23 ENCOUNTER — Encounter: Payer: Self-pay | Admitting: Physical Medicine & Rehabilitation

## 2023-01-23 ENCOUNTER — Encounter
Payer: Federal, State, Local not specified - PPO | Attending: Physical Medicine & Rehabilitation | Admitting: Physical Medicine & Rehabilitation

## 2023-01-23 VITALS — BP 131/96 | HR 94 | Ht 67.0 in | Wt 243.0 lb

## 2023-01-23 DIAGNOSIS — M533 Sacrococcygeal disorders, not elsewhere classified: Secondary | ICD-10-CM | POA: Insufficient documentation

## 2023-01-23 DIAGNOSIS — Z79891 Long term (current) use of opiate analgesic: Secondary | ICD-10-CM | POA: Insufficient documentation

## 2023-01-23 DIAGNOSIS — G8929 Other chronic pain: Secondary | ICD-10-CM | POA: Diagnosis not present

## 2023-01-23 DIAGNOSIS — M545 Low back pain, unspecified: Secondary | ICD-10-CM | POA: Diagnosis not present
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
## 2023-01-26 LAB — TOXASSURE SELECT,+ANTIDEPR,UR

## 2023-02-17 ENCOUNTER — Other Ambulatory Visit: Payer: Self-pay | Admitting: Physical Medicine & Rehabilitation

## 2023-02-17 DIAGNOSIS — G8929 Other chronic pain: Secondary | ICD-10-CM

## 2023-02-20 ENCOUNTER — Ambulatory Visit: Payer: Federal, State, Local not specified - PPO | Admitting: Internal Medicine

## 2023-02-27 ENCOUNTER — Encounter: Payer: Federal, State, Local not specified - PPO | Admitting: Physical Medicine & Rehabilitation

## 2023-03-06 ENCOUNTER — Ambulatory Visit: Payer: Federal, State, Local not specified - PPO | Admitting: Internal Medicine

## 2023-03-20 ENCOUNTER — Ambulatory Visit: Payer: Federal, State, Local not specified - PPO | Admitting: Internal Medicine

## 2023-04-04 ENCOUNTER — Other Ambulatory Visit: Payer: Self-pay

## 2023-04-04 NOTE — Progress Notes (Unsigned)
  PROCEDURE RECORD [facility]   Name: Temitope Fekete DOB:1970-06-13 MRN: [id number]  Date:04/04/2023  Physician: Claudette Laws, MD    Nurse/CMA: Charise Carwin MA  Allergies: No Known Allergies  Consent Signed: {yes [id number]}  Is patient diabetic? {yes no:314532}  CBG today? ***  Pregnant: {yes no:314532} LMP: No LMP for male patient. (age 50-55)  Anticoagulants: {Yes/No:19989} Anti-inflammatory: {Yes/No:19989} Antibiotics: {Yes/No:19989}  Procedure: Bilateral L5 , S1,2,3 sacroiliac blocks   Position: {PRONE/SUPINE/SITTING/LATERAL:21022916} Start Time: ***  End Time: ***  Fluoro Time: ***  RN/CMA Latoiya Maradiaga MA Jonnie Kubly MA    Time      BP      Pulse      Respirations      O2 Sat      S/S      Pain Level       D/C home with ***, [name], D/C instructions reviewed, and sits independently.

## 2023-04-07 ENCOUNTER — Other Ambulatory Visit: Payer: Self-pay | Admitting: Internal Medicine

## 2023-04-07 DIAGNOSIS — F32A Depression, unspecified: Secondary | ICD-10-CM

## 2023-04-09 ENCOUNTER — Encounter: Payer: Federal, State, Local not specified - PPO | Admitting: Psychology

## 2023-04-16 NOTE — Progress Notes (Signed)
  PROCEDURE RECORD [facility]   Name: Nicholas Vargas DOB:05-21-1970 MRN: [id number]  Date:04/16/2023  Physician: Prentice Compton, MD    Nurse/CMA: Tye Kitty MA  Allergies: No Known Allergies  Consent Signed: Yes.    Is patient diabetic? No.  CBG today? NA  Pregnant: No. LMP: No LMP for male patient. (age 50-55)  Anticoagulants: no Anti-inflammatory: no Antibiotics: no  Procedure: Bilateral L5, S1,2,3 Sacroiliac   Position: Prone Start Time: 9:47 am  End Time: 10:08 am  Fluoro Time: 1:28  RN/CMA Maie Kesinger MA Laketta Soderberg MA    Time 8:43 AM 10:13 am    BP 134/76 150/80    Pulse 76 73    Respirations 16 16    O2 Sat 97 99    S/S 6 6    Pain Level 10/10 5/10     D/C home with Family member, [name], D/C instructions reviewed, and sits independently.

## 2023-04-24 ENCOUNTER — Encounter: Payer: Self-pay | Admitting: Physical Medicine & Rehabilitation

## 2023-04-24 ENCOUNTER — Encounter
Payer: Federal, State, Local not specified - PPO | Attending: Physical Medicine & Rehabilitation | Admitting: Physical Medicine & Rehabilitation

## 2023-04-24 VITALS — BP 134/76 | HR 76 | Temp 98.1°F | Ht 67.0 in | Wt 224.0 lb

## 2023-04-24 DIAGNOSIS — M533 Sacrococcygeal disorders, not elsewhere classified: Secondary | ICD-10-CM | POA: Diagnosis not present

## 2023-04-24 MED ORDER — IOHEXOL 180 MG/ML  SOLN
3.0000 mL | Freq: Once | INTRAMUSCULAR | Status: AC
  Administered 2023-04-24: 3 mL

## 2023-04-24 MED ORDER — LIDOCAINE HCL 1 % IJ SOLN
10.0000 mL | Freq: Once | INTRAMUSCULAR | Status: AC
  Administered 2023-04-24: 10 mL

## 2023-04-24 MED ORDER — LIDOCAINE HCL (PF) 2 % IJ SOLN
2.0000 mL | Freq: Once | INTRAMUSCULAR | Status: AC
  Administered 2023-04-24: 2 mL

## 2023-04-24 NOTE — Progress Notes (Signed)
 Bilateral L5DR, S1,2,3 lateral branch blocks under fluoro guidance and contrast enhancement   Indication:  Low back /buttock pain at L5 and below which is moderate to severe intensity and not responsive to conservative care.  Pt had at least 3/5 positive provocative sacroiliac tests on physical examination . No allergy to contrast   Written consent obtained using [place] standard consent form. Patient placed in prone position on fluoroscopy table Betadine prep to the low back region Sterile drape placed Skin and subcutaneous tissue was anesthetized with 1 mL of 1% lidocaine  in each of 8 spots 4 on the right and 4 on the left. A 25-gauge 3-1/2 inch needle was used for the nerve block procedure. The lateral aspect of the left S3 foramen was targeted.  1 bone contact was made, Omnipaque  180 x 0.5 mL was injected showing no evidence of foraminal spread.  Then 0.5 mL of 2% lidocaine  was injected. The lateral aspect of the left S2 foramen was targeted in the 25-gauge 3-1/2 inch needle was inserted to the lateral aspect of the foramen.  Bone contact was made, Omnipaque  180 x 0.5 mL was injected showed no evidence of foraminal spread.  Then 0.5 mL of 2% lidocaine  was injected The lateral aspect of the left S1 foramen was targeted, with 25-gauge 3-1/2 inch needle was inserted to the lateral aspect of the foramen bone contact was made, Omnipaque  180 x 0.5 and was injected and showed no evidence of foraminal spread.  Then 0.5 mL of 2% lidocaine  was injected.  S1 SAP sacral ala junction was targeted, 25-gauge 3-1/2 inch needle was inserted to bone contact.  Omnipaque  180 x 0.5 mL was injected and showed no evidence of spread.  Then 0.5 mL of 2% lidocaine  was injected.  The same procedure was repeated on the right side using same needle injected and technique.  Patient tolerated procedure well Postprocedure instructions given

## 2023-04-24 NOTE — Patient Instructions (Signed)
Sacroiliac nerve block injections performed today. ? ?The injection was done under x-ray guidance with contrast enhancement. ?This procedure has been performed to help reduce low back and buttocks pain as well as potentially hip pain. ?The duration of this injection is variable lasting from hours to  weeks ?If this injection produces a >50%  short term relief of pain, then a radiofrequency ablation of these same nerves may result in a 6mo + pain relief  ?

## 2023-05-15 ENCOUNTER — Encounter: Payer: Self-pay | Admitting: Physical Medicine & Rehabilitation

## 2023-05-15 ENCOUNTER — Encounter
Payer: Federal, State, Local not specified - PPO | Attending: Physical Medicine & Rehabilitation | Admitting: Physical Medicine & Rehabilitation

## 2023-05-15 VITALS — BP 150/89 | HR 90 | Ht 67.0 in | Wt 225.0 lb

## 2023-05-15 DIAGNOSIS — M533 Sacrococcygeal disorders, not elsewhere classified: Secondary | ICD-10-CM | POA: Insufficient documentation

## 2023-05-15 MED ORDER — DIAZEPAM 10 MG PO TABS
10.0000 mg | ORAL_TABLET | Freq: Once | ORAL | 0 refills | Status: AC
Start: 2023-05-15 — End: 2023-05-15

## 2023-05-15 NOTE — Progress Notes (Signed)
Subjective:    Patient ID: Nicholas Vargas, male    DOB: 27-Jun-1970, 53 y.o.   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  His past medical history includes hypertension.  Used to be on tramadol however elevated liver function tests.  He has some tingling pins and needle sensation in the feet, MRI of the lumbar spine 11/26/2018 did not show any present lesions consistent with lower extremity complaints.  No history of diabetes. He is currently on ibuprofen prescribed by PCP No known drug allergies.  MRI LUMBAR SPINE WITHOUT CONTRAST   TECHNIQUE: Multiplanar, multisequence MR imaging of the lumbar spine was performed. No intravenous contrast was administered.   COMPARISON:  Lumbar radiographs 09/19/2018. CT Abdomen and Pelvis 11/23/2017.   FINDINGS: Segmentation:  Normal on the comparison CT.   Alignment: Straightening of lumbar lordosis appears stable since 2019.   Vertebrae: No marrow edema or evidence of acute osseous abnormality. Visualized bone marrow signal is within normal limits. Intact visible sacrum and SI joints.   Conus medullaris and cauda equina: Conus extends to the L1 level. No lower spinal cord or conus signal abnormality.   Paraspinal and other soft tissues: Negative.   Disc levels:   T12-L1:  Negative.   L1-L2:  Negative.   L2-L3:  Negative.   L3-L4: Mild mostly far lateral disc bulging, more so on the left. The foramen are most affected, and there is mild foraminal endplate spurring which is also greater on the left. Mild epidural lipomatosis and posterior element hypertrophy at this level. No spinal or convincing lateral recess stenosis. Mild right and up to moderate left L3 foraminal stenosis.   L4-L5: Mild far lateral disc bulging. Epidural lipomatosis. No convincing stenosis.   L5-S1:  Epidural  lipomatosis but otherwise negative.   IMPRESSION: 1. Mild lumbar disc degeneration, primarily at L3-L4 and affecting the neural foramina more so on the left. Query left L3 radiculitis. 2. Mild lumbar epidural lipomatosis, but no significant spinal stenosis or other convincing neural impingement.     Electronically Signed   By: Odessa Fleming M.D.   On: 11/26/2018 14:15 Pain Inventory Average Pain 8 Pain Right Now 10 My pain is burning, tingling, and pins and needle, pulsating  In the last 24 hours, has pain interfered with the following? General activity 8 Relation with others 9 Enjoyment of life 8 What TIME of day is your pain at its worst? morning , daytime, evening, and night Sleep (in general) Poor  Pain is worse with: walking, bending, sitting, standing, and some activites Pain improves with:  nothing Relief from Meds:  na  No family history on file. Social History   Socioeconomic History   Marital status: Single    Spouse name: Not on file   Number of children: Not on file   Years of education: Not on file   Highest education level: Not on file  Occupational History   Not on file  Tobacco Use   Smoking status: Never   Smokeless tobacco: Never   Tobacco comments:    Vape  Vaping Use   Vaping status: Former  Substance and Sexual Activity   Alcohol use: Not Currently   Drug use: No   Sexual activity: Not on file  Other Topics Concern   Not on file  Social History Narrative   Not on file   Social Drivers  of Health   Financial Resource Strain: Not on file  Food Insecurity: Not on file  Transportation Needs: Not on file  Physical Activity: Not on file  Stress: Not on file  Social Connections: Not on file   No past surgical history on file. No past surgical history on file. Past Medical History:  Diagnosis Date   Depression    Hypertension    BP (!) 150/89   Pulse 90   Ht 5\' 7"  (1.702 m)   Wt 225 lb (102.1 kg)   SpO2 96%   BMI 35.24 kg/m   Opioid  Risk Score:   Fall Risk Score:  `1  Depression screen [facility] 2/9     04/24/2023    8:42 AM 01/23/2023    9:25 AM 01/16/2023    9:46 AM 12/19/2022   10:30 AM 06/20/2022   12:36 PM 08/02/2021   11:54 AM 06/07/2021    9:57 AM  Depression screen PHQ 2/9  Decreased Interest 1 1 3 3 2 3 1   Down, Depressed, Hopeless 1  3 3 2 3 1   PHQ - 2 Score 2 1 6 6 4 6 2   Altered sleeping   3 3  3    Tired, decreased energy   3 3  3    Change in appetite   3 3  0   Feeling bad or failure about yourself    3 3  1    Trouble concentrating   3 3  2    Moving slowly or fidgety/restless   0 2  0   Suicidal thoughts   0 0  0   PHQ-9 Score   21 23  15    Difficult doing work/chores   Very difficult Extremely dIfficult         Review of Systems  Musculoskeletal:  Positive for back pain.       Pain going down back of legs to feet Feet tingle spasms  All other systems reviewed and are negative.     Objective:   Physical Exam  General No acute distress Mood and affect appropriate Negative straight leg raise bilaterally Lower extremity strength is 5/5 bilateral hip flexor knee extensor ankle dorsiflexor  Ambulates without assistive device no evidence of toe drag guilty  Lumbar area minimal tenderness palpation above L5   Sacral thrust (prone) : Positive bilateral Lateral compression: Negative FABER's: Positive right Distraction (supine): Positive left Thigh thrust test: Positive left    Assessment & Plan:   1.  Sacroiliac discomfort left greater than right side with chronic pain.  He is on chronic work restrictions.  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 Work forms completed Taking chronic NSAIDs, unablke to take tramadol Has tried PT >50% relief for 2 d following sacroiliac nerve block  + Sacroiliac provocative testing L>R Schedule left sacroiliac RF under fluoro guidance

## 2023-06-14 NOTE — Progress Notes (Deleted)
  PROCEDURE RECORD [facility]   Name: Nicholas Vargas DOB:1971-01-05 MRN: [id number]  Date:06/14/2023  Physician: Claudette Laws, MD    Nurse/CMA:   Allergies: No Known Allergies  Consent Signed: {yes no:314532}  Is patient diabetic? {yes no:314532}  CBG today? ***  Pregnant: {yes no:314532} LMP: No LMP for male patient. (age 51-55)  Anticoagulants: {Yes/No:19989} Anti-inflammatory: {Yes/No:19989} Antibiotics: {Yes/No:19989}  Procedure: Left Sacroiliac Radio Frequency  Position: Prone Start Time: ***  End Time: ***  Fluoro Time: ***  RN/CMA      Time      BP      Pulse      Respirations      O2 Sat      S/S      Pain Level       D/C home with ***, [name], D/C instructions reviewed, and sits independently.

## 2023-06-19 ENCOUNTER — Encounter: Payer: Federal, State, Local not specified - PPO | Admitting: Physical Medicine & Rehabilitation

## 2023-06-28 ENCOUNTER — Other Ambulatory Visit: Payer: Self-pay | Admitting: Internal Medicine

## 2023-06-28 DIAGNOSIS — F32A Depression, unspecified: Secondary | ICD-10-CM

## 2023-07-17 ENCOUNTER — Encounter: Payer: Self-pay | Admitting: Physical Medicine & Rehabilitation

## 2023-07-17 ENCOUNTER — Encounter: Attending: Physical Medicine & Rehabilitation | Admitting: Physical Medicine & Rehabilitation

## 2023-07-17 VITALS — BP 171/123 | HR 106 | Ht 67.0 in | Wt 240.0 lb

## 2023-07-17 DIAGNOSIS — M533 Sacrococcygeal disorders, not elsewhere classified: Secondary | ICD-10-CM | POA: Diagnosis not present

## 2023-07-17 NOTE — Progress Notes (Signed)
 Subjective:    Patient ID: Nicholas Vargas, male    DOB: Feb 06, 1971, 53 y.o.   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  He has had a significant exacerbation of pain since that time.  His blood pressure is markedly elevated today.  He does have a history of hypertension and is on Zestril and Norvasc however he has generally been well told when he comes to the office. He states that getting in and out of the truck frequently going up and down the steps has been bothering him. We discussed his diagnosis of sacroiliac disorder We discussed his current restrictions and reviewed them.  I have made some adjustments to his restrictions and filled out another restriction form He has had some temporary relief with sacroiliac injections in the past.  He also had temporary relief of 50% with lateral branch blocks performed on 04/24/2023. No loss of bowel or bladder function  Pain Inventory Average Pain 10 Pain Right Now 10 My pain is sharp, tingling, aching, and pins and needles  In the last 24 hours, has pain interfered with the following? General activity 10 Relation with others 10 Enjoyment of life 10 What TIME of day is your pain at its worst? morning , daytime, evening, and night Sleep (in general) Poor  Pain is worse with: walking, bending, sitting, standing, some activites, and driving Pain improves with:  . Relief from Meds:  .  No family history on file. Social History   Socioeconomic History   Marital status: Single    Spouse name: Not on file   Number of children: Not on file   Years of education: Not on file   Highest education level: Not on file  Occupational History   Not on file  Tobacco Use   Smoking status: Never   Smokeless tobacco: Never   Tobacco comments:    Vape  Vaping Use   Vaping status: Former  Substance and Sexual Activity   Alcohol use: Not Currently   Drug use: No   Sexual  activity: Not on file  Other Topics Concern   Not on file  Social History Narrative   Not on file   Social Drivers of Health   Financial Resource Strain: Not on file  Food Insecurity: Not on file  Transportation Needs: Not on file  Physical Activity: Not on file  Stress: Not on file  Social Connections: Not on file   No past surgical history on file. No past surgical history on file. Past Medical History:  Diagnosis Date   Depression    Hypertension    BP (!) 171/123   Pulse (!) 106   Ht 5\' 7"  (1.702 m)   Wt 240 lb (108.9 kg)   SpO2 98%   BMI 37.59 kg/m   Opioid Risk Score:   Fall Risk Score:  `1  Depression screen [facility] 2/9     04/24/2023    8:42 AM 01/23/2023    9:25 AM 01/16/2023    9:46 AM 12/19/2022   10:30 AM 06/20/2022   12:36 PM 08/02/2021   11:54 AM 06/07/2021    9:57 AM  Depression screen PHQ 2/9  Decreased Interest 1 1 3 3 2 3 1   Down, Depressed, Hopeless 1  3 3 2 3 1   PHQ - 2 Score 2 1 6 6 4 6 2   Altered sleeping   3 3  3    Tired, decreased  energy   3 3  3    Change in appetite   3 3  0   Feeling bad or failure about yourself    3 3  1    Trouble concentrating   3 3  2    Moving slowly or fidgety/restless   0 2  0   Suicidal thoughts   0 0  0   PHQ-9 Score   21 23  15    Difficult doing work/chores   Very difficult Extremely dIfficult        Review of Systems  Musculoskeletal:  Positive for back pain.       Spasms  All other systems reviewed and are negative.      Objective:   Physical Exam +pain behaviors with minimal palpation ROM   Sacral thrust (prone) : Positive right Lateral compression: Positive right greater than left FABER's: Positive bilateral Distraction (supine): Positive bilateral Thigh thrust test: Negative Negative straight leg raise Motor strength is normal lower extremities Has good lumbar flexion pain with lumbar extension Ambulates without assistive device no evidence toe drag or knee instability     Assessment &  Plan:   1.  Sacroiliac disorder this appears to be the primary pain generator.  We discussed that sacroiliac blocks were helpful on a temporary basis, he is scheduled for right L5 dorsal ramus S1-S2-S3 lateral branch radiofrequency neurotomy under fluoroscopic guidance. We also discussed on a long-term basis it would be helpful for him to lose about 50 pounds.  We discussed portion size we did discuss using a stationary bicycle for exercise as well as pacing his exercise appropriately.

## 2023-07-24 ENCOUNTER — Encounter
Payer: Federal, State, Local not specified - PPO | Attending: Physical Medicine & Rehabilitation | Admitting: Physical Medicine & Rehabilitation

## 2023-07-24 ENCOUNTER — Encounter: Payer: Self-pay | Admitting: Physical Medicine & Rehabilitation

## 2023-07-24 DIAGNOSIS — M533 Sacrococcygeal disorders, not elsewhere classified: Secondary | ICD-10-CM | POA: Insufficient documentation

## 2023-07-24 MED ORDER — LIDOCAINE HCL 1 % IJ SOLN
10.0000 mL | Freq: Once | INTRAMUSCULAR | Status: AC
  Administered 2023-07-24: 10 mL

## 2023-07-24 MED ORDER — LIDOCAINE HCL (PF) 2 % IJ SOLN
4.0000 mL | Freq: Once | INTRAMUSCULAR | Status: AC
  Administered 2023-07-24: 4 mL

## 2023-07-24 NOTE — Progress Notes (Signed)
RIGHT Sacroiliac radio frequency ablation under fluoroscopic guidance This consists of L5 dorsal ramus radio frequency ablation plus neuro lysis of S1-S2 -S3 lateral branches  Indication is sacroiliac pain which has improved temporarily onby at least 50% following sacroiliac intra-articular injection and L5 , S1,2,3 Lateral branch blocks under fluoroscopic guidance. Pain interferes with self-care and mobility and has failed to respond to conservative measures.  Informed consent was obtained after discussing risks and benefits of the procedure with the patient these include bleeding bruising and infection temporary or permanent paralysis. The patient elects to proceed and has given written consent.  Patient placed prone on fluoroscopy table. Area marked and prepped with Betadine. Fluoroscopic images utilized to guide needle. 25-gauge 1.5 inch needle was used to anesthetize 4 injection points with 2 cc of 1% lidocaine each. Then a 18-gauge 10 cm RF needle with a 10 mm curved active tip was inserted under fluoroscopic guidance first targeting the S1 SA P./sacral ala junction, bone contact made and confirmed with lateral imaging.  motor stimulation at 2 Hz confirm proper needle location followed by injection of one cc of a solution containing   1% lidocaine MPF. Radio frequency 80C for 80 seconds was performed. Then the inferolateral aspect of the S1, S2 and lateral aspect of S3 sacral foramina were targeted. Bone contact made.  motor stim at 2 Hz confirm proper needle location. One ML of the /lidocaine solution was injected into each of 3 sites and radio frequency ablation 80C for 90 seconds was performed. Patient tolerated procedure well. Post procedure instructions given  

## 2023-07-24 NOTE — Progress Notes (Signed)
  PROCEDURE RECORD [facility]   Name: Ziah Leandro DOB:1970/05/05 MRN: [id number]  Date:07/24/2023  Physician: Claudette Laws, MD    Nurse/CMA: Nedra Hai, CMA  Allergies: No Known Allergies  Consent Signed: Yes.    Is patient diabetic? No.  CBG today? .  Pregnant: No. LMP: No LMP for male patient. (age 51-55)  Anticoagulants: no Anti-inflammatory: yes (Methocarbamal) Antibiotics: no  Procedure: Right Sacroiliac Radiofrequency  Position: Prone Start Time: 2:02 pm  End Time: 2:26 pm  Fluoro Time: 1:33  RN/CMA Nedra Hai, CMA Miko Sirico, CMA    Time 1:56 pm 2:30 pm    BP 143/96 160/81      Pulse 108 97    Respirations 16 16    O2 Sat 98 97    S/S 6 6    Pain Level 8/10 3/10     D/C home with transportation, [name], D/C instructions reviewed, and sits independently.

## 2023-08-02 ENCOUNTER — Encounter: Payer: Self-pay | Admitting: Physical Medicine & Rehabilitation

## 2023-08-02 ENCOUNTER — Encounter: Admitting: Physical Medicine & Rehabilitation

## 2023-08-02 VITALS — BP 145/79 | HR 118 | Ht 67.0 in | Wt 231.0 lb

## 2023-08-02 DIAGNOSIS — M533 Sacrococcygeal disorders, not elsewhere classified: Secondary | ICD-10-CM

## 2023-08-02 NOTE — Progress Notes (Signed)
 Subjective:    Patient ID: Nicholas Vargas, male    DOB: 22-Nov-1970, 53 y.o.   MRN: [id number]  HPI  53year-old male with chronic right low back and buttock discomfort.  He has a history of right sacroiliac disorder which has impacted his work activities. Patient doing much better since radiofrequency Previously was at a 10/10 pain now down to a 3/10 pain. He has some work restriction forms from the [employer] which he needs completed today. Pain Inventory Average Pain 3 Pain Right Now 3 My pain is  .  In the last 24 hours, has pain interfered with the following? General activity 0 Relation with others 0 Enjoyment of life 0 What TIME of day is your pain at its worst? varies Sleep (in general) Good  Pain is worse with:  . Pain improves with:  . Relief from Meds:  .  No family history on file. Social History   Socioeconomic History   Marital status: Single    Spouse name: Not on file   Number of children: Not on file   Years of education: Not on file   Highest education level: Not on file  Occupational History   Not on file  Tobacco Use   Smoking status: Never   Smokeless tobacco: Never   Tobacco comments:    Vape  Vaping Use   Vaping status: Former  Substance and Sexual Activity   Alcohol use: Not Currently   Drug use: No   Sexual activity: Not on file  Other Topics Concern   Not on file  Social History Narrative   Not on file   Social Drivers of Health   Financial Resource Strain: Not on file  Food Insecurity: Not on file  Transportation Needs: Not on file  Physical Activity: Not on file  Stress: Not on file  Social Connections: Not on file   No past surgical history on file. No past surgical history on file. Past Medical History:  Diagnosis Date   Depression    Hypertension    BP (!) 145/79   Pulse (!) 118   Ht 5\' 7"  (1.702 m)   Wt 231 lb (104.8 kg)   SpO2 98%   BMI 36.18 kg/m   Opioid Risk Score:   Fall Risk Score:   `1  Depression screen [facility] 2/9     04/24/2023    8:42 AM 01/23/2023    9:25 AM 01/16/2023    9:46 AM 12/19/2022   10:30 AM 06/20/2022   12:36 PM 08/02/2021   11:54 AM 06/07/2021    9:57 AM  Depression screen PHQ 2/9  Decreased Interest 1 1 3 3 2 3 1   Down, Depressed, Hopeless 1  3 3 2 3 1   PHQ - 2 Score 2 1 6 6 4 6 2   Altered sleeping   3 3  3    Tired, decreased energy   3 3  3    Change in appetite   3 3  0   Feeling bad or failure about yourself    3 3  1    Trouble concentrating   3 3  2    Moving slowly or fidgety/restless   0 2  0   Suicidal thoughts   0 0  0   PHQ-9 Score   21 23  15    Difficult doing work/chores   Very difficult Extremely dIfficult       Review of Systems  Musculoskeletal:  Positive for back pain.  All  other systems reviewed and are negative.     Objective:   Physical Exam General No acute distress Mood affect appropriate Extremities without edema Negative straight leg raising bilateral lower extremities Normal strength bilateral lower extremities Positive pain with right Faber's at the right SI area Negative pain with left Faber's Ambulates without assistive device no evidence of drag or knee stability Lumbar range of motion is normal      Assessment & Plan:  1.  Right sacroiliac discomfort with chronic pain improved after radiofrequency neurotomy right L5 dorsal ramus right S1-S2-S3 lateral branches. While his pain is improved certainly at risk for worsening if he does not follow his work restrictions.  Have written these out once again.  I will see him back in June for follow-up appointment.  We discussed portion restriction, stationary bicycling

## 2023-08-03 ENCOUNTER — Ambulatory Visit: Admitting: Physical Medicine & Rehabilitation

## 2023-10-09 ENCOUNTER — Encounter: Admitting: Physical Medicine & Rehabilitation

## 2023-10-23 ENCOUNTER — Encounter: Admitting: Physical Medicine & Rehabilitation

## 2023-11-20 ENCOUNTER — Encounter: Admitting: Physical Medicine & Rehabilitation

## 2023-12-28 ENCOUNTER — Encounter: Attending: Physical Medicine & Rehabilitation | Admitting: Physical Medicine & Rehabilitation

## 2023-12-28 ENCOUNTER — Encounter: Payer: Self-pay | Admitting: Physical Medicine & Rehabilitation

## 2023-12-28 VITALS — BP 147/94 | HR 105 | Ht 67.0 in | Wt 247.0 lb

## 2023-12-28 DIAGNOSIS — M533 Sacrococcygeal disorders, not elsewhere classified: Secondary | ICD-10-CM | POA: Diagnosis not present
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
## 2023-12-28 NOTE — Patient Instructions (Signed)
 Back Exercises These exercises help to make your trunk and back strong. They also help to keep the lower back flexible. Doing these exercises can help to prevent or lessen pain in your lower back. If you have back pain, try to do these exercises 2-3 times each day or as told by your doctor. As you get better, do the exercises once each day. Repeat the exercises more often as told by your doctor. To stop back pain from coming back, do the exercises once each day, or as told by your doctor. Do exercises exactly as told by your doctor. Stop right away if you feel sudden pain or your pain gets worse. Exercises Single knee to chest Do these steps 3-5 times in a row for each leg: Lie on your back on a firm bed or the floor with your legs stretched out. Bring one knee to your chest. Grab your knee or thigh with both hands and hold it in place. Pull on your knee until you feel a gentle stretch in your lower back or butt. Keep doing the stretch for 10-30 seconds. Slowly let go of your leg and straighten it. Pelvic tilt Do these steps 5-10 times in a row: Lie on your back on a firm bed or the floor with your legs stretched out. Bend your knees so they point up to the ceiling. Your feet should be flat on the floor. Tighten your lower belly (abdomen) muscles to press your lower back against the floor. This will make your tailbone point up to the ceiling instead of pointing down to your feet or the floor. Stay in this position for 5-10 seconds while you gently tighten your muscles and breathe evenly. Cat-cow Do these steps until your lower back bends more easily: Get on your hands and knees on a firm bed or the floor. Keep your hands under your shoulders, and keep your knees under your hips. You may put padding under your knees. Let your head hang down toward your chest. Tighten (contract) the muscles in your belly. Point your tailbone toward the floor so your lower back becomes rounded like the back of a  cat. Stay in this position for 5 seconds. Slowly lift your head. Let the muscles of your belly relax. Point your tailbone up toward the ceiling so your back forms a sagging arch like the back of a cow. Stay in this position for 5 seconds.  Press-ups Do these steps 5-10 times in a row: Lie on your belly (face-down) on a firm bed or the floor. Place your hands near your head, about shoulder-width apart. While you keep your back relaxed and keep your hips on the floor, slowly straighten your arms to raise the top half of your body and lift your shoulders. Do not use your back muscles. You may change where you place your hands to make yourself more comfortable. Stay in this position for 5 seconds. Keep your back relaxed. Slowly return to lying flat on the floor.  Bridges Do these steps 10 times in a row: Lie on your back on a firm bed or the floor. Bend your knees so they point up to the ceiling. Your feet should be flat on the floor. Your arms should be flat at your sides, next to your body. Tighten your butt muscles and lift your butt off the floor until your waist is almost as high as your knees. If you do not feel the muscles working in your butt and the back of  your thighs, slide your feet 1-2 inches (2.5-5 cm) farther away from your butt. Stay in this position for 3-5 seconds. Slowly lower your butt to the floor, and let your butt muscles relax. If this exercise is too easy, try doing it with your arms crossed over your chest. Belly crunches Do these steps 5-10 times in a row: Lie on your back on a firm bed or the floor with your legs stretched out. Bend your knees so they point up to the ceiling. Your feet should be flat on the floor. Cross your arms over your chest. Tip your chin a little bit toward your chest, but do not bend your neck. Tighten your belly muscles and slowly raise your chest just enough to lift your shoulder blades a tiny bit off the floor. Avoid raising your body  higher than that because it can put too much stress on your lower back. Slowly lower your chest and your head to the floor. Back lifts Do these steps 5-10 times in a row: Lie on your belly (face-down) with your arms at your sides, and rest your forehead on the floor. Tighten the muscles in your legs and your butt. Slowly lift your chest off the floor while you keep your hips on the floor. Keep the back of your head in line with the curve in your back. Look at the floor while you do this. Stay in this position for 3-5 seconds. Slowly lower your chest and your face to the floor. Contact a doctor if: Your back pain gets a lot worse when you do an exercise. Your back pain does not get better within 2 hours after you exercise. If you have any of these problems, stop doing the exercises. Do not do them again unless your doctor says it is okay. Get help right away if: You have sudden, very bad back pain. If this happens, stop doing the exercises. Do not do them again unless your doctor says it is okay. This information is not intended to replace advice given to you by your health care provider. Make sure you discuss any questions you have with your health care provider. Document Revised: 06/23/2020 Document Reviewed: 06/23/2020 Elsevier Patient Education  2024 [employer].

## 2024-01-22 ENCOUNTER — Encounter: Payer: Federal, State, Local not specified - PPO | Admitting: Psychology

## 2024-02-06 ENCOUNTER — Other Ambulatory Visit: Payer: Self-pay | Admitting: Internal Medicine

## 2024-02-06 DIAGNOSIS — K219 Gastro-esophageal reflux disease without esophagitis: Secondary | ICD-10-CM

## 2024-02-06 DIAGNOSIS — E782 Mixed hyperlipidemia: Secondary | ICD-10-CM

## 2024-03-02 ENCOUNTER — Other Ambulatory Visit: Payer: Self-pay | Admitting: Internal Medicine

## 2024-03-02 DIAGNOSIS — I10 Essential (primary) hypertension: Secondary | ICD-10-CM

## 2024-03-02 DIAGNOSIS — F32A Depression, unspecified: Secondary | ICD-10-CM

## 2024-03-25 ENCOUNTER — Encounter: Admitting: Physical Medicine & Rehabilitation

## 2024-05-06 ENCOUNTER — Encounter
Payer: Worker's Compensation | Attending: Physical Medicine & Rehabilitation | Admitting: Physical Medicine & Rehabilitation

## 2024-05-06 ENCOUNTER — Other Ambulatory Visit: Payer: Self-pay | Admitting: Physical Medicine & Rehabilitation

## 2024-05-06 ENCOUNTER — Encounter: Payer: Self-pay | Admitting: Physical Medicine & Rehabilitation

## 2024-05-06 VITALS — BP 143/85 | HR 106 | Ht 67.0 in | Wt 247.0 lb

## 2024-05-06 DIAGNOSIS — M533 Sacrococcygeal disorders, not elsewhere classified: Secondary | ICD-10-CM | POA: Diagnosis not present

## 2024-05-06 MED ORDER — DICLOFENAC EPOLAMINE 1.3 % EX PTCH: 1.0000 | MEDICATED_PATCH | Freq: Two times a day (BID) | CUTANEOUS | 1 refills | Status: AC

## 2024-05-06 NOTE — Patient Instructions (Signed)
" °  VISIT SUMMARY: You visited today due to the recurrence of chronic right sacroiliac joint pain. Your symptoms have gradually returned over the past eight to nine months after your last radiofrequency ablation.  YOUR PLAN: RIGHT CHRONIC SACROILIAC DISORDER: You have chronic right sacroiliac joint pain that has recurred nine months after your last radiofrequency ablation. -We have scheduled another radiofrequency ablation for your right sacroiliac joint. -You are prescribed Flector  (diclofenac ) patch for daytime symptomatic relief. -Avoid using meloxicam while using the Flector  patch. -If you take alprazolam  before the procedure, you will need a driver. -Nursing will provide you with pre-procedure forms and instructions. -Schedule a follow-up appointment three months after the procedure.                      Contains text generated by [employer]bridge.                                 Contains text generated by [employer]bridge.   "

## 2024-05-06 NOTE — Progress Notes (Signed)
 "  Subjective:    Patient ID: Nicholas Vargas, male    DOB: [birth year], 54 y.o.   MRN: [id number]  HPI Discussed the use of AI scribe software for clinical note transcription with the patient, who gave verbal consent to proceed.  History of Present Illness Nicholas Vargas is a 54 year old with chronic right sacroiliac joint pain who presents for recurrence of symptoms.  He has chronic right sacroiliac joint pain, previously managed with radiofrequency ablation and injections. The most recent radiofrequency ablation was performed on July 24, 2023, resulting in significant and sustained relief, which he describes as the best outcome compared to prior treatments.  Over the past eight to nine months, symptoms have gradually recurred, characterized by burning pain localized to the right sacroiliac region. He reports burning pain localized to the right sacroiliac region and that his symptoms have gradually recurred over the past eight to nine months.  He is not currently taking meloxicam or other anti-inflammatory medications. Current medications include Lexapro  for depression, alprazolam  for anxiety, and topiramate  for weight loss. He has not experienced changes in work duties or insurance coverage since the last visit.  The patient brought in a form for work restrictions that was completed today.  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  Pain Inventory Average Pain 7 Pain Right Now 8 My pain is constant, sharp, burning, dull, stabbing, tingling, and aching  In the last 24 hours, has pain interfered with the following? General activity 8 Relation with others 5 Enjoyment of life 9 What TIME of day is your pain at its worst? morning , daytime, evening, and varies Sleep (in general) Poor  Pain is worse with: bending, sitting, standing, and some activites Pain improves with: rest, heat/ice, pacing activities, and injections Relief from Meds: 5  No  family history on file. Social History   Socioeconomic History   Marital status: Single    Spouse name: Not on file   Number of children: Not on file   Years of education: Not on file   Highest education level: Not on file  Occupational History   Not on file  Tobacco Use   Smoking status: Never   Smokeless tobacco: Never   Tobacco comments:    Vape  Vaping Use   Vaping status: Former  Substance and Sexual Activity   Alcohol use: Not Currently   Drug use: No   Sexual activity: Not on file  Other Topics Concern   Not on file  Social History Narrative   Not on file   Social Drivers of Health   Tobacco Use: Low Risk (12/28/2023)   Patient History    Smoking Tobacco Use: Never    Smokeless Tobacco Use: Never    Passive Exposure: Not on file  Financial Resource Strain: Not on file  Food Insecurity: Not on file  Transportation Needs: Not on file  Physical Activity: Not on file  Stress: Not on file  Social Connections: Not on file  Depression (PHQ2-9): Low Risk (05/06/2024)   Depression (PHQ2-9)    PHQ-2 Score: 2  Alcohol Screen: Not on file  Housing: Not on file  Utilities: Not on file  Health Literacy: Not on file   No past surgical history on file. No past surgical history on file. Past Medical History:  Diagnosis Date   Depression    Hypertension    Ht 5' 7 (1.702 m)   Wt 247 lb (112 kg)   BMI 38.69  kg/m   Opioid Risk Score:   Fall Risk Score:  `1  Depression screen [facility] 2/9     05/06/2024   11:06 AM 12/28/2023    1:43 PM 04/24/2023    8:42 AM 01/23/2023    9:25 AM 01/16/2023    9:46 AM 12/19/2022   10:30 AM 06/20/2022   12:36 PM  Depression screen PHQ 2/9  Decreased Interest 1 1 1 1 3 3 2   Down, Depressed, Hopeless 1 1 1  3 3 2   PHQ - 2 Score 2 2 2 1 6 6 4   Altered sleeping     3 3   Tired, decreased energy     3 3   Change in appetite     3 3   Feeling bad or failure about yourself      3 3   Trouble concentrating     3 3   Moving slowly or  fidgety/restless     0 2   Suicidal thoughts     0 0   PHQ-9 Score     21  23    Difficult doing work/chores     Very difficult Extremely dIfficult      Data saved with a previous flowsheet row definition    Review of Systems  Musculoskeletal:  Positive for back pain.       Feet tingling & numb, upper thighs cramping  All other systems reviewed and are negative.      Objective:   Physical Exam General No acute distress Mood affect appropriate Extremities without edema Ambulates without assistive device no evidence of toe drag or knee instability Lumbar spine has tenderness around the right PSIS there is no pain with forward flexion but there is pain with extension.   Sacral thrust (prone) :+ R Lateral compression:+ R FABER's: +R Distraction (supine): - Thigh thrust test: +R       Assessment & Plan:   Assessment and Plan Assessment & Plan Right chronic sacroiliac disorder Chronic right sacroiliac disorder with symptom recurrence nine months post-radiofrequency ablation, consistent with expected duration of benefit. - Scheduled radiofrequency ablation of the right sacroiliac joint. - Prescribed Flector  (diclofenac ) patch for daytime symptomatic relief. - Instructed to avoid concurrent use of meloxicam with Flector  patch. - Advised driver requirement if alprazolam  is taken pre-procedure. - Arranged nursing to provide pre-procedure form and instructions. - Instructed to schedule follow-up three months post-procedure.    "

## 2024-06-13 ENCOUNTER — Encounter: Admitting: Physical Medicine & Rehabilitation
